# Patient Record
Sex: Male | Born: 1990 | Race: Black or African American | Hispanic: No | Marital: Single | Smoking: Current every day smoker
Health system: Southern US, Community
[De-identification: ages and names within clinical notes are randomized; demographics above are authoritative.]

## PROBLEM LIST (undated history)

## (undated) DIAGNOSIS — B20 Human immunodeficiency virus [HIV] disease: Secondary | ICD-10-CM

## (undated) HISTORY — PX: NO PAST SURGERIES: SHX2092

---
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
## 2011-06-15 ENCOUNTER — Emergency Department (HOSPITAL_COMMUNITY)
Admission: EM | Admit: 2011-06-15 | Discharge: 2011-06-15 | Disposition: A | Discharge status: Home / Self Care | Payer: Federal, State, Local not specified - PPO | Attending: Emergency Medicine | Admitting: Emergency Medicine

## 2011-06-15 ENCOUNTER — Emergency Department (HOSPITAL_COMMUNITY): Payer: Federal, State, Local not specified - PPO

## 2011-06-15 DIAGNOSIS — R42 Dizziness and giddiness: Secondary | ICD-10-CM | POA: Insufficient documentation

## 2011-06-15 DIAGNOSIS — R109 Unspecified abdominal pain: Secondary | ICD-10-CM | POA: Insufficient documentation

## 2011-06-15 DIAGNOSIS — R10819 Abdominal tenderness, unspecified site: Secondary | ICD-10-CM | POA: Insufficient documentation

## 2011-06-15 DIAGNOSIS — I498 Other specified cardiac arrhythmias: Secondary | ICD-10-CM | POA: Insufficient documentation

## 2011-06-15 LAB — CBC
MCH: 29.9 pg (ref 26.0–34.0)
MCV: 88.8 fL (ref 78.0–100.0)
Platelets: 247 10*3/uL (ref 150–400)
RDW: 13.9 % (ref 11.5–15.5)
WBC: 4.4 10*3/uL (ref 4.0–10.5)

## 2011-06-15 LAB — URINALYSIS, ROUTINE W REFLEX MICROSCOPIC
Bilirubin Urine: NEGATIVE
Ketones, ur: NEGATIVE mg/dL
Nitrite: NEGATIVE
Specific Gravity, Urine: 1.018 (ref 1.005–1.030)
Urobilinogen, UA: 1 mg/dL (ref 0.0–1.0)

## 2011-06-15 LAB — BASIC METABOLIC PANEL
BUN: 11 mg/dL (ref 6–23)
Calcium: 9.8 mg/dL (ref 8.4–10.5)
Creatinine, Ser: 1.09 mg/dL (ref 0.50–1.35)
GFR calc Af Amer: >90 mL/min (ref >90)
GFR calc non Af Amer: >90 mL/min (ref >90)

## 2011-06-15 LAB — DIFFERENTIAL
Eosinophils Absolute: 0.1 10*3/uL (ref 0.0–0.7)
Eosinophils Relative: 2 % (ref 0–5)
Lymphs Abs: 1.9 10*3/uL (ref 0.7–4.0)

## 2011-12-28 ENCOUNTER — Emergency Department (HOSPITAL_COMMUNITY)
Admission: EM | Admit: 2011-12-28 | Discharge: 2011-12-29 | Disposition: A | Discharge status: Home Health | Payer: Federal, State, Local not specified - PPO | Attending: Emergency Medicine | Admitting: Emergency Medicine

## 2011-12-28 DIAGNOSIS — S0180XA Unspecified open wound of other part of head, initial encounter: Secondary | ICD-10-CM | POA: Insufficient documentation

## 2011-12-28 DIAGNOSIS — S0181XA Laceration without foreign body of other part of head, initial encounter: Secondary | ICD-10-CM
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
## 2011-12-29 MED ORDER — TETANUS-DIPHTH-ACELL PERTUSSIS 5-2.5-18.5 LF-MCG/0.5 IM SUSP
0.5000 mL | Freq: Once | INTRAMUSCULAR | Status: AC
  Administered 2011-12-29: 0.5 mL via INTRAMUSCULAR
  Filled 2011-12-29: qty 0.5

## 2011-12-29 NOTE — Discharge Instructions (Signed)
Facial Laceration A facial laceration is a cut on the face. Lacerations usually heal quickly, but they need special care to reduce scarring. It will take 1 to 2 years for the scar to lose its redness and to heal completely. TREATMENT  Some facial lacerations may not require closure. Some lacerations may not be able to be closed due to an increased risk of infection. It is important to see your caregiver as soon as possible after an injury to minimize the risk of infection and to maximize the opportunity for successful closure. If closure is appropriate, pain medicines may be given, if needed. The wound will be cleaned to help prevent infection. Your caregiver will use stitches (sutures), staples, wound glue (adhesive), or skin adhesive strips to repair the laceration. These tools bring the skin edges together to allow for faster healing and a better cosmetic outcome. However, all wounds will heal with a scar.  Once the wound has healed, scarring can be minimized by covering the wound with sunscreen during the day for 1 full year. Use a sunscreen with an SPF of at least 30. Sunscreen helps to reduce the pigment that will form in the scar. When applying sunscreen to a completely healed wound, massage the scar for a few minutes to help reduce the appearance of the scar. Use circular motions with your fingertips, on and around the scar. Do not massage a healing wound. HOME CARE INSTRUCTIONS For sutures:  Keep the wound clean and dry.   If you were given a bandage (dressing), you should change it at least once a day. Also change the dressing if it becomes wet or dirty, or as directed by your caregiver.   Wash the wound with soap and water 2 times a day. Rinse the wound off with water to remove all soap. Pat the wound dry with a clean towel.   After cleaning, apply a thin layer of the antibiotic ointment recommended by your caregiver. This will help prevent infection and keep the dressing from sticking.    You may shower as usual after the first 24 hours. Do not soak the wound in water until the sutures are removed.   Only take over-the-counter or prescription medicines for pain, discomfort, or fever as directed by your caregiver.   Get your sutures removed as directed by your caregiver. With facial lacerations, sutures should usually be taken out after 4 to 5 days to avoid stitch marks.   Wait a few days after your sutures are removed before applying makeup.  For skin adhesive strips:  Keep the wound clean and dry.   Do not get the skin adhesive strips wet. You may bathe carefully, using caution to keep the wound dry.   If the wound gets wet, pat it dry with a clean towel.   Skin adhesive strips will fall off on their own. You may trim the strips as the wound heals. Do not remove skin adhesive strips that are still stuck to the wound. They will fall off in time.  For wound adhesive:  You may briefly wet your wound in the shower or bath. Do not soak or scrub the wound. Do not swim. Avoid periods of heavy perspiration until the skin adhesive has fallen off on its own. After showering or bathing, gently pat the wound dry with a clean towel.   Do not apply liquid medicine, cream medicine, ointment medicine, or makeup to your wound while the skin adhesive is in place. This may loosen the film   before your wound is healed.   If a dressing is placed over the wound, be careful not to apply tape directly over the skin adhesive. This may cause the adhesive to be pulled off before the wound is healed.   Avoid prolonged exposure to sunlight or tanning lamps while the skin adhesive is in place. Exposure to ultraviolet light in the first year will darken the scar.   The skin adhesive will usually remain in place for 5 to 10 days, then naturally fall off the skin. Do not pick at the adhesive film.  You may need a tetanus shot if:  You cannot remember when you had your last tetanus shot.   You have  never had a tetanus shot.  If you get a tetanus shot, your arm may swell, get red, and feel warm to the touch. This is common and not a problem. If you need a tetanus shot and you choose not to have one, there is a rare chance of getting tetanus. Sickness from tetanus can be serious. SEEK IMMEDIATE MEDICAL CARE IF:  You develop redness, pain, or swelling around the wound.   There is yellowish-white fluid (pus) coming from the wound.   You develop chills or a fever.  MAKE SURE YOU:  Understand these instructions.   Will watch your condition.   Will get help right away if you are not doing well or get worse.  Document Released: 09/30/2004 Document Revised: 08/12/2011 Document Reviewed: 02/15/2011 Exit[employer] Patient Information 2012 [employer].Assault, General Assault includes any behavior, whether intentional or reckless, which results in bodily injury to another person and/or damage to property. Included in this would be any behavior, intentional or reckless, that by its nature would be understood (interpreted) by a reasonable person as intent to harm another person or to damage his/her property. Threats may be oral or written. They may be communicated through regular mail, computer, fax, or phone. These threats may be direct or implied. FORMS OF ASSAULT INCLUDE:  Physically assaulting a person. This includes physical threats to inflict physical harm as well as:   Slapping.   Hitting.   Poking.   Kicking.   Punching.   Pushing.   Arson.   Sabotage.   Equipment vandalism.   Damaging or destroying property.   Throwing or hitting objects.   Displaying a weapon or an object that appears to be a weapon in a threatening manner.   Carrying a firearm of any kind.   Using a weapon to harm someone.   Using greater physical size/strength to intimidate another.   Making intimidating or threatening gestures.   Bullying.   Hazing.   Intimidating, threatening, hostile,  or abusive language directed toward another person.   It communicates the intention to engage in violence against that person. And it leads a reasonable person to expect that violent behavior may occur.   Stalking another person.  IF IT HAPPENS AGAIN:  Immediately call for emergency help (911 in [place]   If someone poses clear and immediate danger to you, seek legal authorities to have a protective or restraining order put in place.   Less threatening assaults can at least be reported to authorities.  STEPS TO TAKE IF A SEXUAL ASSAULT HAS HAPPENED  Go to an area of safety. This may include a shelter or staying with a friend. Stay away from the area where you have been attacked. A large percentage of sexual assaults are caused by a friend, relative or associate.     If medications were given by your caregiver, take them as directed for the full length of time prescribed.   Only take over-the-counter or prescription medicines for pain, discomfort, or fever as directed by your caregiver.   If you have come in contact with a sexual disease, find out if you are to be tested again. If your caregiver is concerned about the HIV/AIDS virus, he/she may require you to have continued testing for several months.   For the protection of your privacy, test results can not be given over the phone. Make sure you receive the results of your test. If your test results are not back during your visit, make an appointment with your caregiver to find out the results. Do not assume everything is normal if you have not heard from your caregiver or the medical facility. It is important for you to follow up on all of your test results.   File appropriate papers with authorities. This is important in all assaults, even if it has occurred in a family or by a friend.  SEEK MEDICAL CARE IF:  You have new problems because of your injuries.   You have problems that may be because of the medicine you are taking, such as:     Rash.   Itching.   Swelling.   Trouble breathing.   You develop belly (abdominal) pain, feel sick to your stomach (nausea) or are vomiting.   You begin to run a temperature.   You need supportive care or referral to a rape crisis center. These are centers with trained personnel who can help you get through this ordeal.  SEEK IMMEDIATE MEDICAL CARE IF:  You are afraid of being threatened, beaten, or abused. In [place] call 911.   You receive new injuries related to abuse.   You develop severe pain in any area injured in the assault or have any change in your condition that concerns you.   You faint or lose consciousness.   You develop chest pain or shortness of breath.  Document Released: 08/23/2005 Document Revised: 08/12/2011 Document Reviewed: 04/10/2008 [employer] Patient Information 2012 [employer]. 
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
## 2011-12-31 NOTE — ED Provider Notes (Signed)
Medical screening examination/treatment/procedure(s) were performed by non-physician practitioner and as supervising physician I was immediately available for consultation/collaboration. Devoria Albe, MD, FACEP   Ward Givens, MD 12/31/11 [phone]

## 2012-09-02 ENCOUNTER — Emergency Department (HOSPITAL_COMMUNITY)
Admission: EM | Admit: 2012-09-02 | Discharge: 2012-09-02 | Disposition: A | Discharge status: Home / Self Care | Payer: Federal, State, Local not specified - PPO | Attending: Emergency Medicine | Admitting: Emergency Medicine

## 2012-09-02 ENCOUNTER — Encounter (HOSPITAL_COMMUNITY): Payer: Self-pay | Admitting: Emergency Medicine

## 2012-09-02 DIAGNOSIS — F17200 Nicotine dependence, unspecified, uncomplicated: Secondary | ICD-10-CM | POA: Insufficient documentation

## 2012-09-02 DIAGNOSIS — R112 Nausea with vomiting, unspecified: Secondary | ICD-10-CM | POA: Insufficient documentation

## 2012-09-02 DIAGNOSIS — R197 Diarrhea, unspecified: Secondary | ICD-10-CM | POA: Insufficient documentation

## 2012-09-02 MED ORDER — LOPERAMIDE HCL 2 MG PO CAPS: 4.0000 mg | ORAL_CAPSULE | ORAL | Status: DC | PRN

## 2012-09-02 MED ORDER — ONDANSETRON 8 MG PO TBDP
8.0000 mg | ORAL_TABLET | Freq: Once | ORAL | Status: AC
  Administered 2012-09-02: 8 mg via ORAL
  Filled 2012-09-02: qty 1
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
## 2012-09-02 NOTE — ED Notes (Signed)
Pt able to tolerate the water that was given to pt. Pt states he is feeling better and is ok going home.  MD made aware.

## 2014-04-20 ENCOUNTER — Encounter (HOSPITAL_COMMUNITY): Payer: Self-pay | Admitting: Emergency Medicine

## 2014-04-20 ENCOUNTER — Emergency Department (HOSPITAL_COMMUNITY): Payer: Federal, State, Local not specified - PPO

## 2014-04-20 ENCOUNTER — Emergency Department (HOSPITAL_COMMUNITY)
Admission: EM | Admit: 2014-04-20 | Discharge: 2014-04-20 | Disposition: A | Discharge status: Home / Self Care | Payer: Federal, State, Local not specified - PPO | Attending: Emergency Medicine | Admitting: Emergency Medicine

## 2014-04-20 DIAGNOSIS — Y929 Unspecified place or not applicable: Secondary | ICD-10-CM | POA: Insufficient documentation

## 2014-04-20 DIAGNOSIS — S46909A Unspecified injury of unspecified muscle, fascia and tendon at shoulder and upper arm level, unspecified arm, initial encounter: Secondary | ICD-10-CM | POA: Insufficient documentation

## 2014-04-20 DIAGNOSIS — IMO0002 Reserved for concepts with insufficient information to code with codable children: Secondary | ICD-10-CM | POA: Insufficient documentation

## 2014-04-20 DIAGNOSIS — F17200 Nicotine dependence, unspecified, uncomplicated: Secondary | ICD-10-CM | POA: Diagnosis not present

## 2014-04-20 DIAGNOSIS — R296 Repeated falls: Secondary | ICD-10-CM | POA: Insufficient documentation

## 2014-04-20 DIAGNOSIS — L089 Local infection of the skin and subcutaneous tissue, unspecified: Secondary | ICD-10-CM | POA: Insufficient documentation

## 2014-04-20 DIAGNOSIS — S4980XA Other specified injuries of shoulder and upper arm, unspecified arm, initial encounter: Secondary | ICD-10-CM | POA: Insufficient documentation

## 2014-04-20 DIAGNOSIS — S6990XA Unspecified injury of unspecified wrist, hand and finger(s), initial encounter: Secondary | ICD-10-CM

## 2014-04-20 DIAGNOSIS — S59901A Unspecified injury of right elbow, initial encounter: Secondary | ICD-10-CM

## 2014-04-20 DIAGNOSIS — S59919A Unspecified injury of unspecified forearm, initial encounter: Secondary | ICD-10-CM

## 2014-04-20 DIAGNOSIS — Y9389 Activity, other specified: Secondary | ICD-10-CM | POA: Insufficient documentation

## 2014-04-20 DIAGNOSIS — S59909A Unspecified injury of unspecified elbow, initial encounter: Secondary | ICD-10-CM | POA: Diagnosis not present

## 2014-04-20 MED ORDER — TRAMADOL HCL 50 MG PO TABS: 50.0000 mg | ORAL_TABLET | Freq: Four times a day (QID) | ORAL | Status: DC | PRN

## 2014-04-20 MED ORDER — CLINDAMYCIN HCL 150 MG PO CAPS: 300.0000 mg | ORAL_CAPSULE | Freq: Three times a day (TID) | ORAL | Status: DC

## 2014-04-20 NOTE — ED Provider Notes (Signed)
Medical screening examination/treatment/procedure(s) were performed by non-physician practitioner and as supervising physician I was immediately available for consultation/collaboration.   EKG Interpretation None       Dakisha Schoof, MD 04/20/14 2323 

## 2014-04-20 NOTE — ED Notes (Signed)
Pt presents with c/o fall that occurred approx 2 weeks ago. Pt says that he fell onto his right arm and he is unable to completely stretch that arm out. Pt says that he has a scab on that arm as well and is worried that it might be infected. NAD at this time.

## 2014-04-20 NOTE — ED Provider Notes (Signed)
CSN: [id number]     Arrival date & time 04/20/14  1653 History  This chart was scribed for non-physician practitioner, Emilia BeckKaitlyn Kyriana Yankee, PA-C, working with Doug SouSam Jacubowitz, MD, by Bronson CurbJacqueline Melvin, ED Scribe. This patient was seen in room WTR9/WTR9 and the patient's care was started at 5:57 PM.    Chief Complaint  Patient presents with  . Arm Injury     Patient is a 23 y.o. male presenting with arm injury. The history is provided by the patient. No language interpreter was used.  Arm Injury Location:  Elbow Time since incident:  2 weeks Elbow location:  R elbow Pain details:    Radiates to:  Does not radiate   Severity:  Mild   Onset quality:  Sudden   Timing:  Constant   Progression:  Worsening Chronicity:  New Dislocation: no   Foreign body present:  No foreign bodies Prior injury to area:  No Relieved by:  None tried Worsened by:  Nothing tried Ineffective treatments:  None tried Associated symptoms: decreased range of motion   Associated symptoms: no back pain, no fatigue, no fever, no muscle weakness, no neck pain, no numbness, no stiffness, no swelling and no tingling   Risk factors: no concern for non-accidental trauma, no known bone disorder, no frequent fractures and no recent illness     HPI Comments: Duane Oconnell is a 23 y.o. male who presents to the Emergency Department complaining of right arm injury that occurred when patient fell approximately 2 weeks ago. Patient states he fell onto his right arm and is unable to "stretch his arm out" completely. There is associated abrasion that has scabbed over to his right elbow. Patient is concerned for infection. He denies any other injuries. Patient has no history of significant health conditions.   History reviewed. No pertinent past medical history. History reviewed. No pertinent past surgical history. No family history on file. History  Substance Use Topics  . Smoking status: Current Every Day Smoker -- 0.50  packs/day    Types: Cigarettes  . Smokeless tobacco: Not on file  . Alcohol Use: No    Review of Systems  Constitutional: Negative for fever and fatigue.  Musculoskeletal: Negative for back pain, neck pain and stiffness.      Allergies  Review of patient's allergies indicates no known allergies.  Home Medications   Prior to Admission medications   Medication Sig Start Date End Date Taking? Authorizing Provider  ibuprofen (ADVIL,MOTRIN) 200 MG tablet Take 200 mg by mouth every 6 (six) hours as needed. For pain    Historical Provider, MD   Triage Vitals: BP 140/80  Pulse 62  Temp(Src) 98.3 F (36.8 C) (Oral)  Resp 24  SpO2 99%  Physical Exam  Nursing note and vitals reviewed. Constitutional: He is oriented to person, place, and time. He appears well-developed and well-nourished. No distress.  HENT:  Head: Normocephalic and atraumatic.  Eyes: Conjunctivae and EOM are normal.  Neck: Neck supple. No tracheal deviation present.  Cardiovascular: Normal rate.   Pulmonary/Chest: Effort normal. No respiratory distress.  Musculoskeletal:  Patient is unable to fully extend elbow due to pain. No obvious deformity.  Neurological: He is alert and oriented to person, place, and time.  Skin: Skin is warm and dry.  3x3 cm circular, crusting lesion of right elbow that is TTP.  Psychiatric: He has a normal mood and affect. His behavior is normal.    ED Course  Procedures (including critical care time)  DIAGNOSTIC STUDIES: Oxygen  Saturation is 99% on room air, normal by my interpretation.    COORDINATION OF CARE: At 1807 Discussed treatment plan with patient which includes Ultram and Cleocin. Patient agrees.   Labs Review Labs Reviewed - No data to display  Imaging Review Dg Elbow Complete Right  04/20/2014   CLINICAL DATA:  Fall.  Right elbow pain.  EXAM: RIGHT ELBOW - COMPLETE 3+ VIEW  COMPARISON:  None.  FINDINGS: Posterior soft tissue defect/laceration. No underlying bony  abnormality. No fracture, subluxation or dislocation. No joint effusion.  IMPRESSION: Posterior laceration.  No acute bony abnormality.   Electronically Signed   By: Charlett Nose M.D.   On: 04/20/2014 17:48     EKG Interpretation None      MDM   Final diagnoses:  Elbow injury, right, initial encounter  Skin infection    Xray unremarkable for acute changes. Patient has a skin infection overlying the affected area. Patient will be treated with Clindamycin and Tramadol. Patient instructed to follow up with Dr. Orlan Leavens if elbow pain does not resolve in 1 week.   I personally performed the services described in this documentation, which was scribed in my presence. The recorded information has been reviewed and is accurate.    Emilia Beck, PA-C 04/20/14 1902

## 2014-04-20 NOTE — Discharge Instructions (Signed)
Take Clindamycin as directed until gone. Take Tramadol as needed for pain. Follow up with Dr. Melvyn Novasrtmann regarding your elbow if symptoms do not improve in 1 week.

## 2014-11-04 ENCOUNTER — Emergency Department (HOSPITAL_COMMUNITY)
Admission: EM | Admit: 2014-11-04 | Discharge: 2014-11-04 | Disposition: A | Discharge status: Home / Self Care | Payer: Federal, State, Local not specified - PPO | Attending: Emergency Medicine | Admitting: Emergency Medicine

## 2014-11-04 ENCOUNTER — Encounter (HOSPITAL_COMMUNITY): Payer: Self-pay

## 2014-11-04 DIAGNOSIS — Z79899 Other long term (current) drug therapy: Secondary | ICD-10-CM | POA: Insufficient documentation

## 2014-11-04 DIAGNOSIS — R109 Unspecified abdominal pain: Secondary | ICD-10-CM | POA: Diagnosis not present

## 2014-11-04 DIAGNOSIS — R197 Diarrhea, unspecified: Secondary | ICD-10-CM | POA: Diagnosis not present

## 2014-11-04 DIAGNOSIS — R51 Headache: Secondary | ICD-10-CM | POA: Diagnosis present

## 2014-11-04 DIAGNOSIS — Z720 Tobacco use: Secondary | ICD-10-CM | POA: Diagnosis not present

## 2014-11-04 DIAGNOSIS — R519 Headache, unspecified: Secondary | ICD-10-CM

## 2014-11-04 LAB — COMPREHENSIVE METABOLIC PANEL
ALBUMIN: 4.6 g/dL (ref 3.5–5.2)
ALT: 25 U/L (ref 0–53)
ANION GAP: 8 (ref 5–15)
AST: 22 U/L (ref 0–37)
Alkaline Phosphatase: 109 U/L (ref 39–117)
BUN: 16 mg/dL (ref 6–23)
CALCIUM: 9.8 mg/dL (ref 8.4–10.5)
CO2: 30 mmol/L (ref 19–32)
Chloride: 100 mmol/L (ref 96–112)
Creatinine, Ser: 1.16 mg/dL (ref 0.50–1.35)
GFR calc non Af Amer: 87 mL/min — ABNORMAL LOW (ref >90)
GLUCOSE: 95 mg/dL (ref 70–99)
Potassium: 4.2 mmol/L (ref 3.5–5.1)
SODIUM: 138 mmol/L (ref 135–145)
TOTAL PROTEIN: 8.6 g/dL — AB (ref 6.0–8.3)
Total Bilirubin: 0.8 mg/dL (ref 0.3–1.2)

## 2014-11-04 LAB — CBC WITH DIFFERENTIAL/PLATELET
BASOS ABS: 0 10*3/uL (ref 0.0–0.1)
Basophils Relative: 1 % (ref 0–1)
Eosinophils Absolute: 0.1 10*3/uL (ref 0.0–0.7)
Eosinophils Relative: 1 % (ref 0–5)
HEMATOCRIT: 45 % (ref 39.0–52.0)
HEMOGLOBIN: 15.2 g/dL (ref 13.0–17.0)
LYMPHS PCT: 35 % (ref 12–46)
Lymphs Abs: 1.6 10*3/uL (ref 0.7–4.0)
MCH: 30.4 pg (ref 26.0–34.0)
MCHC: 33.8 g/dL (ref 30.0–36.0)
MCV: 90 fL (ref 78.0–100.0)
MONO ABS: 0.4 10*3/uL (ref 0.1–1.0)
MONOS PCT: 9 % (ref 3–12)
NEUTROS ABS: 2.5 10*3/uL (ref 1.7–7.7)
NEUTROS PCT: 54 % (ref 43–77)
Platelets: 268 10*3/uL (ref 150–400)
RBC: 5 MIL/uL (ref 4.22–5.81)
RDW: 14.3 % (ref 11.5–15.5)
WBC: 4.6 10*3/uL (ref 4.0–10.5)

## 2014-11-04 LAB — URINALYSIS, ROUTINE W REFLEX MICROSCOPIC
Bilirubin Urine: NEGATIVE
GLUCOSE, UA: NEGATIVE mg/dL
HGB URINE DIPSTICK: NEGATIVE
KETONES UR: NEGATIVE mg/dL
Leukocytes, UA: NEGATIVE
Nitrite: NEGATIVE
PROTEIN: NEGATIVE mg/dL
Specific Gravity, Urine: 1.016 (ref 1.005–1.030)
UROBILINOGEN UA: 1 mg/dL (ref 0.0–1.0)
pH: 7 (ref 5.0–8.0)

## 2014-11-04 MED ORDER — ONDANSETRON HCL 4 MG PO TABS: 4.0000 mg | ORAL_TABLET | Freq: Four times a day (QID) | ORAL | Status: DC

## 2014-11-04 MED ORDER — SODIUM CHLORIDE 0.9 % IV BOLUS (SEPSIS)
1000.0000 mL | Freq: Once | INTRAVENOUS | Status: AC
  Administered 2014-11-04: 1000 mL via INTRAVENOUS

## 2014-11-04 MED ORDER — KETOROLAC TROMETHAMINE 30 MG/ML IJ SOLN
30.0000 mg | Freq: Once | INTRAMUSCULAR | Status: AC
  Administered 2014-11-04: 30 mg via INTRAVENOUS
  Filled 2014-11-04: qty 1

## 2014-11-04 MED ORDER — ONDANSETRON 8 MG PO TBDP
8.0000 mg | ORAL_TABLET | Freq: Once | ORAL | Status: AC
  Administered 2014-11-04: 8 mg via ORAL
  Filled 2014-11-04: qty 1

## 2014-11-04 MED ORDER — DIPHENHYDRAMINE HCL 50 MG/ML IJ SOLN
25.0000 mg | Freq: Once | INTRAMUSCULAR | Status: AC
  Administered 2014-11-04: 25 mg via INTRAVENOUS
  Filled 2014-11-04: qty 1

## 2014-11-04 MED ORDER — METOCLOPRAMIDE HCL 5 MG/ML IJ SOLN
10.0000 mg | Freq: Once | INTRAMUSCULAR | Status: AC
  Administered 2014-11-04: 10 mg via INTRAVENOUS
  Filled 2014-11-04: qty 2

## 2014-11-04 NOTE — ED Provider Notes (Signed)
CSN: [id number]     Arrival date & time 11/04/14  1036 History   First MD Initiated Contact with Patient 11/04/14 1222     Chief Complaint  Patient presents with  . Abdominal Pain  . Headache     (Consider location/radiation/quality/duration/timing/severity/associated sxs/prior Treatment) HPI Comments: Patient presents to the emergency department with chief complaint of headache. He states that he woke up with a headache this morning. States that he then became nauseated after heat, school and had one episode of vomiting. He denies any numbness, weakness, tingling, vision changes, or other symptoms. Denies any fevers chills. There are no aggravating or alleviating factors. Patient states headache was gradual in onset and has progressively worsened.  The history is provided by the patient. No language interpreter was used.    History reviewed. No pertinent past medical history. History reviewed. No pertinent past surgical history. History reviewed. No pertinent family history. History  Substance Use Topics  . Smoking status: Current Every Day Smoker -- 0.50 packs/day    Types: Cigarettes  . Smokeless tobacco: Not on file  . Alcohol Use: No    Review of Systems  Constitutional: Negative for fever and chills.  Respiratory: Negative for shortness of breath.   Cardiovascular: Negative for chest pain.  Gastrointestinal: Positive for nausea, vomiting and diarrhea. Negative for constipation.  Genitourinary: Negative for dysuria.  Neurological: Positive for headaches.  All other systems reviewed and are negative.     Allergies  Review of patient's allergies indicates no known allergies.  Home Medications   Prior to Admission medications   Medication Sig Start Date End Date Taking? Authorizing Provider  clindamycin (CLEOCIN) 150 MG capsule Take 2 capsules (300 mg total) by mouth 3 (three) times daily. May dispense as  capsules Patient not taking: Reported on 11/04/2014  04/20/14   Emilia Beck, PA-C  traMADol (ULTRAM) 50 MG tablet Take 1 tablet (50 mg total) by mouth every 6 (six) hours as needed. Patient not taking: Reported on 11/04/2014 04/20/14   Kaitlyn Szekalski, PA-C   BP 145/91 mmHg  Pulse 63  Temp(Src) 98.9 F (37.2 C) (Oral)  Resp 16  SpO2 99% Physical Exam  Constitutional: He is oriented to person, place, and time. He appears well-developed and well-nourished.  HENT:  Head: Normocephalic and atraumatic.  Right Ear: External ear normal.  Left Ear: External ear normal.  Eyes: Conjunctivae and EOM are normal. Pupils are equal, round, and reactive to light.  Neck: Normal range of motion. Neck supple.  No pain with neck flexion, no meningismus  Cardiovascular: Normal rate, regular rhythm and normal heart sounds.  Exam reveals no gallop and no friction rub.   No murmur heard. Pulmonary/Chest: Effort normal and breath sounds normal. No respiratory distress. He has no wheezes. He has no rales. He exhibits no tenderness.  Abdominal: Soft. He exhibits no distension and no mass. There is no tenderness. There is no rebound and no guarding.  No focal abdominal tenderness, no RLQ tenderness or pain at McBurney's point, no RUQ tenderness or Murphy's sign, no left-sided abdominal tenderness, no fluid wave, or signs of peritonitis   Musculoskeletal: Normal range of motion. He exhibits no edema or tenderness.  Normal gait.  Neurological: He is alert and oriented to person, place, and time. He has normal reflexes.  CN 3-12 intact, normal finger to nose, no pronator drift, sensation and strength intact bilaterally.  Skin: Skin is warm and dry.  Psychiatric: He has a normal mood and affect. His behavior is  normal. Judgment and thought content normal.  Nursing note and vitals reviewed.   ED Course  Procedures (including critical care time) Results for orders placed or performed during the hospital encounter of 11/04/14  CBC with Differential  Result  Value Ref Range   WBC 4.6 4.0 - 10.5 K/uL   RBC 5.00 4.22 - 5.81 MIL/uL   Hemoglobin 15.2 13.0 - 17.0 g/dL   HCT 81.145.0 91.439.0 - 78.252.0 %   MCV 90.0 78.0 - 100.0 fL   MCH 30.4 26.0 - 34.0 pg   MCHC 33.8 30.0 - 36.0 g/dL   RDW 95.614.3 21.311.5 - 08.615.5 %   Platelets 268 150 - 400 K/uL   Neutrophils Relative % 54 43 - 77 %   Neutro Abs 2.5 1.7 - 7.7 K/uL   Lymphocytes Relative 35 12 - 46 %   Lymphs Abs 1.6 0.7 - 4.0 K/uL   Monocytes Relative 9 3 - 12 %   Monocytes Absolute 0.4 0.1 - 1.0 K/uL   Eosinophils Relative 1 0 - 5 %   Eosinophils Absolute 0.1 0.0 - 0.7 K/uL   Basophils Relative 1 0 - 1 %   Basophils Absolute 0.0 0.0 - 0.1 K/uL  Comprehensive metabolic panel  Result Value Ref Range   Sodium 138 135 - 145 mmol/L   Potassium 4.2 3.5 - 5.1 mmol/L   Chloride 100 96 - 112 mmol/L   CO2 30 19 - 32 mmol/L   Glucose, Bld 95 70 - 99 mg/dL   BUN 16 6 - 23 mg/dL   Creatinine, Ser 5.781.16 0.50 - 1.35 mg/dL   Calcium 9.8 8.4 - 46.910.5 mg/dL   Total Protein 8.6 (H) 6.0 - 8.3 g/dL   Albumin 4.6 3.5 - 5.2 g/dL   AST 22 0 - 37 U/L   ALT 25 0 - 53 U/L   Alkaline Phosphatase 109 39 - 117 U/L   Total Bilirubin 0.8 0.3 - 1.2 mg/dL   GFR calc non Af Amer 87 (L) >90 mL/min   GFR calc Af Amer >90 >90 mL/min   Anion gap 8 5 - 15  Urinalysis, Routine w reflex microscopic  Result Value Ref Range   Color, Urine YELLOW YELLOW   APPearance CLEAR CLEAR   Specific Gravity, Urine 1.016 1.005 - 1.030   pH 7.0 5.0 - 8.0   Glucose, UA NEGATIVE NEGATIVE mg/dL   Hgb urine dipstick NEGATIVE NEGATIVE   Bilirubin Urine NEGATIVE NEGATIVE   Ketones, ur NEGATIVE NEGATIVE mg/dL   Protein, ur NEGATIVE NEGATIVE mg/dL   Urobilinogen, UA 1.0 0.0 - 1.0 mg/dL   Nitrite NEGATIVE NEGATIVE   Leukocytes, UA NEGATIVE NEGATIVE   No results found.    EKG Interpretation None      MDM   Final diagnoses:  Headache, unspecified headache type  2. Nausea, vomiting, and diarrhea  Pt HA treated and improved while in ED.   Presentation is like pts typical HA and non concerning for [facility], ICH, Meningitis, or temporal arteritis. Pt is afebrile with no focal neuro deficits, nuchal rigidity, or change in vision. Pt is to follow up with PCP to discuss prophylactic medication. Pt verbalizes understanding and is agreeable with plan to dc.   Patient instructed to return for:  New or worsening symptoms, including, increased abdominal pain, especially pain that localizes to one side, bloody vomit, bloody diarrhea, fever >101, and intractable vomiting.      Roxy Horsemanobert Mirabella Hilario, PA-C 11/04/14 1415  Toy CookeyMegan Docherty, MD 11/05/14 [phone]

## 2014-11-04 NOTE — ED Notes (Signed)
Pt states woke up this morning with headache.  States he was in class and became nauseated.  Vomited small amount x 1 today.  No fever.

## 2014-11-04 NOTE — Discharge Instructions (Signed)
Nausea and Vomiting °Nausea is a sick feeling that often comes before throwing up (vomiting). Vomiting is a reflex where stomach contents come out of your mouth. Vomiting can cause severe loss of body fluids (dehydration). Children and elderly adults can become dehydrated quickly, especially if they also have diarrhea. Nausea and vomiting are symptoms of a condition or disease. It is important to find the cause of your symptoms. °CAUSES  °· Direct irritation of the stomach lining. This irritation can result from increased acid production (gastroesophageal reflux disease), infection, food poisoning, taking certain medicines (such as nonsteroidal anti-inflammatory drugs), alcohol use, or tobacco use. °· Signals from the brain. These signals could be caused by a headache, heat exposure, an inner ear disturbance, increased pressure in the brain from injury, infection, a tumor, or a concussion, pain, emotional stimulus, or metabolic problems. °· An obstruction in the gastrointestinal tract (bowel obstruction). °· Illnesses such as diabetes, hepatitis, gallbladder problems, appendicitis, kidney problems, cancer, sepsis, atypical symptoms of a heart attack, or eating disorders. °· Medical treatments such as chemotherapy and radiation. °· Receiving medicine that makes you sleep (general anesthetic) during surgery. °DIAGNOSIS °Your caregiver may ask for tests to be done if the problems do not improve after a few days. Tests may also be done if symptoms are severe or if the reason for the nausea and vomiting is not clear. Tests may include: °· Urine tests. °· Blood tests. °· Stool tests. °· Cultures (to look for evidence of infection). °· X-rays or other imaging studies. °Test results can help your caregiver make decisions about treatment or the need for additional tests. °TREATMENT °You need to stay well hydrated. Drink frequently but in small amounts. You may wish to drink water, sports drinks, clear broth, or eat frozen  ice pops or gelatin dessert to help stay hydrated. When you eat, eating slowly may help prevent nausea. There are also some antinausea medicines that may help prevent nausea. °HOME CARE INSTRUCTIONS  °· Take all medicine as directed by your caregiver. °· If you do not have an appetite, do not force yourself to eat. However, you must continue to drink fluids. °· If you have an appetite, eat a normal diet unless your caregiver tells you differently. °· Eat a variety of complex carbohydrates (rice, wheat, potatoes, bread), lean meats, yogurt, fruits, and vegetables. °· Avoid high-fat foods because they are more difficult to digest. °· Drink enough water and fluids to keep your urine clear or pale yellow. °· If you are dehydrated, ask your caregiver for specific rehydration instructions. Signs of dehydration may include: °· Severe thirst. °· Dry lips and mouth. °· Dizziness. °· Dark urine. °· Decreasing urine frequency and amount. °· Confusion. °· Rapid breathing or pulse. °SEEK IMMEDIATE MEDICAL CARE IF:  °· You have blood or brown flecks (like coffee grounds) in your vomit. °· You have black or bloody stools. °· You have a severe headache or stiff neck. °· You are confused. °· You have severe abdominal pain. °· You have chest pain or trouble breathing. °· You do not urinate at least once every 8 hours. °· You develop cold or clammy skin. °· You continue to vomit for longer than 24 to 48 hours. °· You have a fever. °MAKE SURE YOU:  °· Understand these instructions. °· Will watch your condition. °· Will get help right away if you are not doing well or get worse. °Document Released: 08/23/2005 Document Revised: 11/15/2011 Document Reviewed: 01/20/2011 °Exit[employer]® Patient Information ©2015 [employer]. This information is not intended   to replace advice given to you by your health care provider. Make sure you discuss any questions you have with your health care provider. °General Headache Without Cause °A headache is pain  or discomfort felt around the head or neck area. The specific cause of a headache may not be found. There are many causes and types of headaches. A few common ones are: °· Tension headaches. °· Migraine headaches. °· Cluster headaches. °· Chronic daily headaches. °HOME CARE INSTRUCTIONS  °· Keep all follow-up appointments with your caregiver or any specialist referral. °· Only take over-the-counter or prescription medicines for pain or discomfort as directed by your caregiver. °· Lie down in a dark, quiet room when you have a headache. °· Keep a headache journal to find out what may trigger your migraine headaches. For example, write down: °¨ What you eat and drink. °¨ How much sleep you get. °¨ Any change to your diet or medicines. °· Try massage or other relaxation techniques. °· Put ice packs or heat on the head and neck. Use these 3 to 4 times per day for 15 to 20 minutes each time, or as needed. °· Limit stress. °· Sit up straight, and do not tense your muscles. °· Quit smoking if you smoke. °· Limit alcohol use. °· Decrease the amount of caffeine you drink, or stop drinking caffeine. °· Eat and sleep on a regular schedule. °· Get 7 to 9 hours of sleep, or as recommended by your caregiver. °· Keep lights dim if bright lights bother you and make your headaches worse. °SEEK MEDICAL CARE IF:  °· You have problems with the medicines you were prescribed. °· Your medicines are not working. °· You have a change from the usual headache. °· You have nausea or vomiting. °SEEK IMMEDIATE MEDICAL CARE IF:  °· Your headache becomes severe. °· You have a fever. °· You have a stiff neck. °· You have loss of vision. °· You have muscular weakness or loss of muscle control. °· You start losing your balance or have trouble walking. °· You feel faint or pass out. °· You have severe symptoms that are different from your first symptoms. °MAKE SURE YOU:  °· Understand these instructions. °· Will watch your condition. °· Will get help  right away if you are not doing well or get worse. °Document Released: 08/23/2005 Document Revised: 11/15/2011 Document Reviewed: 09/08/2011 °Exit[employer]® Patient Information ©2015 [employer]. This information is not intended to replace advice given to you by your health care provider. Make sure you discuss any questions you have with your health care provider. ° °

## 2015-04-12 ENCOUNTER — Encounter (HOSPITAL_COMMUNITY): Payer: Self-pay | Admitting: Emergency Medicine

## 2015-04-12 ENCOUNTER — Emergency Department (HOSPITAL_COMMUNITY): Payer: Federal, State, Local not specified - PPO

## 2015-04-12 ENCOUNTER — Emergency Department (HOSPITAL_COMMUNITY)
Admission: EM | Admit: 2015-04-12 | Discharge: 2015-04-12 | Disposition: A | Discharge status: Home / Self Care | Payer: Federal, State, Local not specified - PPO | Attending: Emergency Medicine | Admitting: Emergency Medicine

## 2015-04-12 DIAGNOSIS — Z720 Tobacco use: Secondary | ICD-10-CM | POA: Insufficient documentation

## 2015-04-12 DIAGNOSIS — R0602 Shortness of breath: Secondary | ICD-10-CM | POA: Insufficient documentation

## 2015-04-12 DIAGNOSIS — R000 Tachycardia, unspecified: Secondary | ICD-10-CM | POA: Diagnosis not present

## 2015-04-12 DIAGNOSIS — F1410 Cocaine abuse, uncomplicated: Secondary | ICD-10-CM | POA: Diagnosis not present

## 2015-04-12 DIAGNOSIS — R079 Chest pain, unspecified: Secondary | ICD-10-CM

## 2015-04-12 DIAGNOSIS — F1210 Cannabis abuse, uncomplicated: Secondary | ICD-10-CM | POA: Diagnosis not present

## 2015-04-12 DIAGNOSIS — R509 Fever, unspecified: Secondary | ICD-10-CM | POA: Insufficient documentation

## 2015-04-12 LAB — BASIC METABOLIC PANEL
Anion gap: 10 (ref 5–15)
BUN: 11 mg/dL (ref 6–20)
CO2: 23 mmol/L (ref 22–32)
Calcium: 8.7 mg/dL — ABNORMAL LOW (ref 8.9–10.3)
Chloride: 105 mmol/L (ref 101–111)
Creatinine, Ser: 1.38 mg/dL — ABNORMAL HIGH (ref 0.61–1.24)
GFR calc Af Amer: >60 mL/min (ref >60)
GFR calc non Af Amer: >60 mL/min (ref >60)
Glucose, Bld: 121 mg/dL — ABNORMAL HIGH (ref 65–99)
Potassium: 3.9 mmol/L (ref 3.5–5.1)
Sodium: 138 mmol/L (ref 135–145)

## 2015-04-12 LAB — RAPID URINE DRUG SCREEN, HOSP PERFORMED
Amphetamines: NOT DETECTED
Barbiturates: NOT DETECTED
Benzodiazepines: NOT DETECTED
Cocaine: POSITIVE — AB
Opiates: NOT DETECTED
TETRAHYDROCANNABINOL: POSITIVE — AB

## 2015-04-12 LAB — D-DIMER, QUANTITATIVE (NOT AT ARMC)

## 2015-04-12 LAB — CBC
HCT: 41.3 % (ref 39.0–52.0)
Hemoglobin: 14.3 g/dL (ref 13.0–17.0)
MCH: 30.6 pg (ref 26.0–34.0)
MCHC: 34.6 g/dL (ref 30.0–36.0)
MCV: 88.2 fL (ref 78.0–100.0)
Platelets: 268 10*3/uL (ref 150–400)
RBC: 4.68 MIL/uL (ref 4.22–5.81)
RDW: 14.1 % (ref 11.5–15.5)
WBC: 4.7 10*3/uL (ref 4.0–10.5)

## 2015-04-12 LAB — I-STAT TROPONIN, ED: Troponin i, poc: 0 ng/mL (ref 0.00–0.08)

## 2015-04-12 LAB — RAPID HIV SCREEN (HIV 1/2 AB+AG)
HIV 1/2 ANTIBODIES: NONREACTIVE
HIV-1 P24 Antigen - HIV24: NONREACTIVE

## 2015-04-12 MED ORDER — LORAZEPAM 2 MG/ML IJ SOLN
1.0000 mg | Freq: Once | INTRAMUSCULAR | Status: AC
  Administered 2015-04-12: 1 mg via INTRAVENOUS
  Filled 2015-04-12: qty 1

## 2015-04-12 MED ORDER — SODIUM CHLORIDE 0.9 % IV BOLUS (SEPSIS)
1000.0000 mL | Freq: Once | INTRAVENOUS | Status: AC
  Administered 2015-04-12: 1000 mL via INTRAVENOUS

## 2015-04-12 MED ORDER — ACETAMINOPHEN 325 MG PO TABS
650.0000 mg | ORAL_TABLET | Freq: Once | ORAL | Status: AC
  Administered 2015-04-12: 650 mg via ORAL
  Filled 2015-04-12: qty 2

## 2015-04-12 NOTE — ED Notes (Signed)
Awake. Verbally responsive. A/O x4. Resp even and unlabored. No audible adventitious breath sounds noted. ABC's intact.  

## 2015-04-12 NOTE — ED Provider Notes (Signed)
CSN: [id number]     Arrival date & time 04/12/15  0603 History   First MD Initiated Contact with Patient 04/12/15 [phone]     Chief Complaint  Patient presents with  . Chest Pain     (Consider location/radiation/quality/duration/timing/severity/associated sxs/prior Treatment) HPI Duane Oconnell is a 24 y.o. male with no medical problems presents to emergency complaining of chest pain and shortness of breath. Patient states that he works third shift and just got off work. 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 Patient states his symptoms started last night after smoking a blunt with a friend. Patient is not sure exactly wasn't a blunt. He denies any other drugs are taking any other pills. He states he does feel feverish as well. He denies any cough or congestion. He denies any abdominal pain. No pain in extremities. No recent travel surgeries. No prior cardiac history. He did not take anything for his symptoms prior to coming in. Patient is also requesting STD check, no symptoms.  History reviewed. No pertinent past medical history. History reviewed. No pertinent past surgical history. History reviewed. No pertinent family history. History  Substance Use Topics  . Smoking status: Current Every Day Smoker -- 0.50 packs/day    Types: Cigarettes  . Smokeless tobacco: Never Used  . Alcohol Use: Yes    Review of Systems  Constitutional: Positive for fever and chills.  Respiratory: Positive for chest tightness and shortness of breath. Negative for cough.   Cardiovascular: Negative for chest pain, palpitations and leg swelling.  Gastrointestinal: Negative for nausea, vomiting, abdominal pain, diarrhea and abdominal distention.  Genitourinary: Negative for dysuria, urgency, frequency, hematuria, penile swelling, scrotal swelling, penile pain and testicular pain.  Musculoskeletal: Negative for myalgias, arthralgias, neck pain and neck stiffness.  Skin: Negative for rash.   Allergic/Immunologic: Negative for immunocompromised state.  Neurological: Negative for dizziness, weakness, light-headedness, numbness and headaches.  All other systems reviewed and are negative.     Allergies  Review of patient's allergies indicates no known allergies.  Home Medications   Prior to Admission medications   Medication Sig Start Date End Date Taking? Authorizing Provider  tetrahydrozoline 0.05 % ophthalmic solution Place 1 drop into both eyes 3 (three) times daily as needed (for contacts.).   Yes Historical Provider, MD  ondansetron (ZOFRAN) 4 MG tablet Take 1 tablet (4 mg total) by mouth every 6 (six) hours. Patient not taking: Reported on 04/12/2015 11/04/14   Roxy Horseman, PA-C   BP 155/86 mmHg  Pulse 135  Temp(Src) 99.9 F (37.7 C) (Oral)  Resp 18  Ht 5\' 9"  (1.753 m)  Wt 140 lb (63.504 kg)  BMI 20.67 kg/m2  SpO2 97% Physical Exam  Constitutional: He appears well-developed and well-nourished. No distress.  HENT:  Head: Normocephalic and atraumatic.  Eyes: Conjunctivae are normal.  Neck: Neck supple.  Cardiovascular: Regular rhythm and normal heart sounds.  Tachycardia present.   Pulmonary/Chest: Effort normal. No respiratory distress. He has no wheezes. He has no rales.  Abdominal: Soft. Bowel sounds are normal. He exhibits no distension. There is no tenderness. There is no rebound.  Musculoskeletal: He exhibits no edema.  Neurological: He is alert.  Skin: Skin is warm and dry.  Nursing note and vitals reviewed.   ED Course  Procedures (including critical care time) Labs Review Labs Reviewed  BASIC METABOLIC PANEL - Abnormal; Notable for the following:    Glucose, Bld 121 (*)    Creatinine, Ser 1.38 (*)    Calcium 8.7 (*)  All other components within normal limits  CBC  D-DIMER, QUANTITATIVE (NOT AT [facility])  RAPID HIV SCREEN (HIV 1/2 AB+AG)  RPR  URINE RAPID DRUG SCREEN, HOSP PERFORMED  I-STAT TROPOININ, ED  GC/CHLAMYDIA PROBE AMP ([facility]) NOT AT [facility]    Imaging Review Dg Chest 2 View  04/12/2015   CLINICAL DATA:  Chest tightness and shortness of breath beginning yesterday. Smoker.  EXAM: CHEST  2 VIEW  COMPARISON:  None.  FINDINGS: Cardiomediastinal silhouette is normal. The lungs are clear without pleural effusions or focal consolidations. Trachea projects midline and there is no pneumothorax. Soft tissue planes and included osseous structures are non-suspicious. Mild broad dextroscoliosis of the upper thoracic spine.  IMPRESSION: Normal chest.   Electronically Signed   By: Awilda Metro M.D.   On: 04/12/2015 06:38     EKG Interpretation   Date/Time:  Saturday April 12 2015 06:16:39 EDT Ventricular Rate:  137 PR Interval:  140 QRS Duration: 93 QT Interval:  292 QTC Calculation: 441 R Axis:   80 Text Interpretation:  Sinus tachycardia LAE, consider biatrial enlargement  RSR' in V1 or V2, probably normal variant Confirmed by Juleen China  MD, STEPHEN  ([id number]) on 04/12/2015 6:25:09 AM      MDM   Final diagnoses:  Cocaine abuse  Marijuana abuse  Chest pain, unspecified chest pain type    patient emergency department with chest pain, shortness of breath, tachycardia, heart rate is 135 and a monitor. Sinus rhythm. Patient admits to smoking a blunt prior to symptoms starting. He is also requesting STD check. He refused GU swab, will send gonorrhea chlamydia by urine, will get RPR and HIV.  9:06 AM Chest x-ray negative, labs unremarkable except for slightly elevated creatinine. Hydrated with 2 L of normal saline. Patient's drug screen is positive for marijuana and cocaine. Patient continues to deny use of cocaine. I do think this is most likely why patient is having his symptoms. His heart rate came down to 80s, after fluids and Ativan. He feels much better. He is asking to be discharged home. At this time VS are all within normal. He is not having any chest pain. D dimer negative, doubt PE. No risk factors for CAD.  Stable for dc home with follow up. Instructed to stop using drugs.   Filed Vitals:   04/12/15 0800  BP: 120/73  Pulse: 100  Temp:   Resp: [address] PA-C 04/12/15 1603  Raeford Razor, MD 04/13/15 [phone]

## 2015-04-12 NOTE — ED Notes (Signed)
Resting quietly with eyes closed. Easily arousable. Verbally responsive. A/O x4. Resp even and unlabored. No audible adventitious breath sounds noted. ABC's intact. SR on monitor. IV saline lock patent and intact.

## 2015-04-12 NOTE — ED Notes (Signed)
Awake. Verbally responsive. A/O x4. Resp even and unlabored. No audible adventitious breath sounds noted. ABC's intact. IV infusing NS at 999ml/hr without difficulty. 

## 2015-04-12 NOTE — Discharge Instructions (Signed)
Take tylenol or ibuprofen for pain. Follow up as needed. Your drug screen came back positive for cocaine and marijuana. Stop using drugs.   Stimulant Use Disorder-Cocaine Cocaine is one of a group of powerful drugs called stimulants. Cocaine has medical uses for stopping nosebleeds and for pain control before minor nose or dental surgery. However, cocaine is misused because of the effects that it produces. These effects include:   A feeling of extreme pleasure.  Alertness.  High energy. Common street names for cocaine include coke, crack, blow, snow, and nose candy. Cocaine is snorted, dissolved in water and injected, or smoked.  Stimulants are addictive because they activate regions of the brain that produce both the pleasurable sensation of "reward" and psychological dependence. Together, these actions account for loss of control and the rapid development of drug dependence. This means you become ill without the drug (withdrawal) and need to keep using it to function.  Stimulant use disorder is use of stimulants that disrupts your daily life. It disrupts relationships with family and friends and how you do your job. Cocaine increases your blood pressure and heart rate. It can cause a heart attack or stroke. Cocaine can also cause death from irregular heart rate or seizures. SYMPTOMS Symptoms of stimulant use disorder with cocaine include:  Use of cocaine in larger amounts or over a longer period of time than intended.  Unsuccessful attempts to cut down or control cocaine use.  A lot of time spent obtaining, using, or recovering from the effects of cocaine.  A strong desire or urge to use cocaine (craving).  Continued use of cocaine in spite of major problems at work, school, or home because of use.  Continued use of cocaine in spite of relationship problems because of use.  Giving up or cutting down on important life activities because of cocaine use.  Use of cocaine over and over in  situations when it is physically hazardous, such as driving a car.  Continued use of cocaine in spite of a physical problem that is likely related to use. Physical problems can include:  Malnutrition.  Nosebleeds.  Chest pain.  High blood pressure.  A hole that develops between the part of your nose that separates your nostrils (perforated nasal septum).  Lung and kidney damage.  Continued use of cocaine in spite of a mental problem that is likely related to use. Mental problems can include:  Schizophrenia-like symptoms.  Depression.  Bipolar mood swings.  Anxiety.  Sleep problems.  Need to use more and more cocaine to get the same effect, or lessened effect over time with use of the same amount of cocaine (tolerance).  Having withdrawal symptoms when cocaine use is stopped, or using cocaine to reduce or avoid withdrawal symptoms. Withdrawal symptoms include:  Depressed or irritable mood.  Low energy or restlessness.  Bad dreams.  Poor or excessive sleep.  Increased appetite. DIAGNOSIS Stimulant use disorder is diagnosed by your health care provider. You may be asked questions about your cocaine use and how it affects your life. A physical exam may be done. A drug screen may be ordered. You may be referred to a mental health professional. The diagnosis of stimulant use disorder requires at least two symptoms within 12 months. The type of stimulant use disorder depends on the number of signs and symptoms you have. The type may be:  Mild. Two or three signs and symptoms.  Moderate. Four or five signs and symptoms.  Severe. Six or more signs and  symptoms. TREATMENT Treatment for stimulant use disorder is usually provided by mental health professionals with training in substance use disorders. The following options are available:  Counseling or talk therapy. Talk therapy addresses the reasons you use cocaine and ways to keep you from using again. Goals of talk therapy  include:  Identifying and avoiding triggers for use.  Handling cravings.  Replacing use with healthy activities.  Support groups. Support groups provide emotional support, advice, and guidance.  Medicine. Certain medicines may decrease cocaine cravings or withdrawal symptoms. HOME CARE INSTRUCTIONS  Take medicines only as directed by your health care provider.  Identify the people and activities that trigger your cocaine use and avoid them.  Keep all follow-up visits as directed by your health care provider. SEEK MEDICAL CARE IF:  Your symptoms get worse or you relapse.  You are not able to take medicines as directed. SEEK IMMEDIATE MEDICAL CARE IF:  You have serious thoughts about hurting yourself or others.  You have a seizure, chest pain, sudden weakness, or loss of speech or vision. FOR MORE INFORMATION  National Institute on Drug Abuse: http://www.price-smith.com/  Substance Abuse and Mental Health Services Administration: SkateOasis.com.pt Document Released: 08/20/2000 Document Revised: 01/07/2014 Document Reviewed: 09/05/2013 [facility] Patient Information 2015 [place], [place]. This information is not intended to replace advice given to you by your health care provider. Make sure you discuss any questions you have with your health care provider.

## 2015-04-12 NOTE — ED Notes (Signed)
Pt presents to ED with c/o shortness of breath, onset yesterday noon.  Pt reports "constant tightness" in chest.  Pt denies dizziness or headache.

## 2015-04-12 NOTE — ED Notes (Signed)
Pt refused urethral swab.

## 2015-04-12 NOTE — ED Notes (Signed)
Lab called and reported that HIV was negative.

## 2015-04-13 LAB — RPR, QUANT+TP ABS (REFLEX)
Rapid Plasma Reagin, Quant: 1:64 {titer} — ABNORMAL HIGH
TREPONEMA PALLIDUM AB: POSITIVE — AB

## 2015-04-13 LAB — RPR: RPR Ser Ql: REACTIVE — AB

## 2015-04-14 ENCOUNTER — Telehealth (HOSPITAL_COMMUNITY): Payer: Self-pay

## 2015-04-14 NOTE — Telephone Encounter (Signed)
Reactive RPR 1:64. Chart sent to EDP office for review.

## 2015-04-16 ENCOUNTER — Encounter (HOSPITAL_COMMUNITY): Payer: Self-pay | Admitting: Emergency Medicine

## 2015-04-16 ENCOUNTER — Emergency Department (HOSPITAL_COMMUNITY)
Admission: EM | Admit: 2015-04-16 | Discharge: 2015-04-16 | Disposition: A | Discharge status: Home / Self Care | Payer: Federal, State, Local not specified - PPO | Attending: Emergency Medicine | Admitting: Emergency Medicine

## 2015-04-16 ENCOUNTER — Telehealth (HOSPITAL_COMMUNITY): Payer: Self-pay

## 2015-04-16 DIAGNOSIS — Z720 Tobacco use: Secondary | ICD-10-CM | POA: Diagnosis not present

## 2015-04-16 DIAGNOSIS — Z202 Contact with and (suspected) exposure to infections with a predominantly sexual mode of transmission: Secondary | ICD-10-CM | POA: Diagnosis present

## 2015-04-16 DIAGNOSIS — A539 Syphilis, unspecified: Secondary | ICD-10-CM | POA: Diagnosis not present

## 2015-04-16 MED ORDER — PENICILLIN G BENZATHINE 1200000 UNIT/2ML IM SUSP
2.4000 10*6.[IU] | Freq: Once | INTRAMUSCULAR | Status: AC
  Administered 2015-04-16: 2.4 10*6.[IU] via INTRAMUSCULAR
  Filled 2015-04-16: qty 4

## 2015-04-16 NOTE — Telephone Encounter (Signed)
Spoke with pt. Will return for treatment of positive RPR. Needs penicillin shot per dr Blinda Leatherwood

## 2015-04-16 NOTE — ED Notes (Addendum)
States he was recently seen here and tested for STDs. Says someone called to tell him the test came back positive for syphilis, and he was advised to come here for antibiotics. States nothing has changed/worsened since he was last seen here, denies pain

## 2015-04-16 NOTE — ED Provider Notes (Signed)
CSN: [id number]     Arrival date & time 04/16/15  1314 History  This chart was scribed for non-physician practitioner, Everlene Farrier, PA-C working with Melene Plan, DO by Gwenyth Ober, ED scribe. This patient was seen in room WTR6/WTR6 and the patient's care was started at 3:22 PM   Chief Complaint  Patient presents with  . Exposure to STD   The history is provided by the patient. No language interpreter was used.    HPI Comments: Duane Oconnell is a 24 y.o. male who presents to the Emergency Department for antibiotics to treat syphilis. He states one, nonpainful lesion on the shaft of his penis that appeared 2 weeks ago. Pt was seen in the ED on 8/6, requested STD testing and was found positive for syphilis. He is sexually active and does not always use protection. Pt denies any allergies to medication. He also denies abdominal pain, nausea, vomiting, dysuria, penile discharge, testicular pain, difficulty urinating, hematuria, urgency, urinary frequency, fever and chills as associated symptoms.   History reviewed. No pertinent past medical history. History reviewed. No pertinent past surgical history. History reviewed. No pertinent family history. Social History  Substance Use Topics  . Smoking status: Current Every Day Smoker -- 0.50 packs/day    Types: Cigarettes  . Smokeless tobacco: Never Used  . Alcohol Use: Yes    Review of Systems  Constitutional: Negative for fever and chills.  Gastrointestinal: Negative for nausea, vomiting and abdominal pain.  Genitourinary: Negative for dysuria, urgency, frequency, hematuria, discharge, penile swelling, scrotal swelling, difficulty urinating, penile pain and testicular pain.  Skin: Positive for rash.  Neurological: Negative for headaches.   Allergies  Review of patient's allergies indicates no known allergies.  Home Medications   Prior to Admission medications   Medication Sig Start Date End Date Taking? Authorizing Provider   tetrahydrozoline 0.05 % ophthalmic solution Place 1 drop into both eyes 3 (three) times daily as needed (for contacts.).   Yes Historical Provider, MD   BP 119/78 mmHg  Pulse 91  Temp(Src) 98.7 F (37.1 C) (Oral)  Resp 16  SpO2 97% Physical Exam  Constitutional: He appears well-developed and well-nourished. No distress.  Nontoxic appearing.  HENT:  Head: Normocephalic and atraumatic.  Mouth/Throat: Oropharynx is clear and moist.  Eyes: Conjunctivae are normal. Pupils are equal, round, and reactive to light. Right eye exhibits no discharge. Left eye exhibits no discharge.  Neck: Neck supple.  Cardiovascular: Normal rate, regular rhythm, normal heart sounds and intact distal pulses.   Pulmonary/Chest: Effort normal and breath sounds normal. No respiratory distress. He has no wheezes. He has no rales.  Abdominal: Soft. There is no tenderness. There is no guarding.  Abdomen is soft and non-tender to palpation.  Genitourinary: No penile tenderness.  Chaperone (scribe) present. 2 lesions noted, 0.5 cm in diameter, 1 at base of shaft of penis, the other distal to that, both are nontender to palpation and consistent with syphilis chancre.  Normal scrotal contents No testicular tenderness No penile discharge No penile tenderness No other rashes or lesions noted  Lymphadenopathy:    He has no cervical adenopathy.  Neurological: He is alert. Coordination normal.  Skin: Skin is warm and dry. No rash noted. He is not diaphoretic.  Psychiatric: He has a normal mood and affect. His behavior is normal.  Nursing note and vitals reviewed.  Chaperone (scribe) was present for exam which was performed with no discomfort or complications.   ED Course  Procedures   DIAGNOSTIC STUDIES:  Oxygen Saturation is 97% on RA, normal by my interpretation.    COORDINATION OF CARE: 3:28 PM Discussed treatment plan with pt which includes GC/CHL and penicillin. Pt agreed to plan.   Labs Review Labs  Reviewed  GC/CHLAMYDIA PROBE AMP ([place]) NOT AT [facility]    Imaging Review No results found.   EKG Interpretation None      Filed Vitals:   04/16/15 1332 04/16/15 1334  BP:  119/78  Pulse: 91   Temp: 98.7 F (37.1 C)   TempSrc: Oral   Resp: 16   SpO2: 97%      MDM   Meds given in ED:  Medications  penicillin g benzathine (BICILLIN LA) 1200000 UNIT/2ML injection 2.4 Million Units (not administered)    New Prescriptions   No medications on file    Final diagnoses:  Syphilis   This is a 24 year old male who presents to the emergency department for treatment of a positive syphilis test. Patient was tested positive for syphilis on 04/12/2015. Patient reports he's had a painless lesion on his penis for the past 2 weeks.He denies any penile discharge. He denies any testicular pain or tenderness. He denies any urinary symptoms. He denies any fevers or chills. Patient had full STD panel however his gonorrhea and Chlamydia testing did not result. This was re-collected today and sent for testing. I advised him he needs to follow-up to ensure the results of this test in 3 days.On exam the patient has a painless chancre at the base of he shaft of his penis consistent with a syphilis chancre.There is no penile tenderness or discharge. Scrotal contents is normal.He is afebrile nontoxic appearing. Patient treated with penicillin in the emergency department. I advised him he needs to follow-up in 6 months and at 12 months to have repeat syphilis testing to ensure the treatment worked.I also advised him to return at any time if he has return of his symptoms. I advised the patient to follow-up with their primary care provider this week. I advised the patient to return to the emergency department with new or worsening symptoms or new concerns. The patient verbalized understanding and agreement with plan.    I personally performed the services described in this documentation, which was scribed in  my presence. The recorded information has been reviewed and is accurate.    Everlene Farrier, PA-C 04/16/15 1546  Melene Plan, DO 04/16/15 2235

## 2015-04-16 NOTE — Discharge Instructions (Signed)
Syphilis Syphilis is an infectious disease. It can cause serious complications if left untreated.  CAUSES  Syphilis is caused by a type of bacteria called Treponema pallidum. It is most commonly spread through sexual contact. Syphilis may also spread to a fetus through the blood of the mother.  SIGNS AND SYMPTOMS Symptoms vary depending on the stage of the disease. Some symptoms may disappear without treatment. However, this does not mean that the infection is gone. One form of syphilis (called latent syphilis) has no symptoms.  Primary Syphilis  Painless sores (chancres) in and around the genital organs and mouth.  Swollen lymph nodes near the sores. Secondary Syphilis  A rash or sores over any portion of the body, including the palms of the hands and soles of the feet.  Fever.  Headache.  Sore throat.  Swollen lymph nodes.  New sores in the mouth or on the genitals.  Feeling generally ill.  Having pain in the joints. Tertiary Syphilis The third stage of syphilis involves severe damage to different organs in the body, such as the brain, spinal cord, and heart. Signs and symptoms may include:   Dementia.  Personality and mood changes.  Difficulty walking.  Heart failure.  Fainting.  Enlargement (aneurysm) of the aorta.  Tumors of the skin, bones, or liver.  Muscle weakness.  Sudden "lightning" pains, numbness, or tingling.  Problems with coordination.  Vision changes. DIAGNOSIS   A physical exam will be done.  Blood tests will be done to confirm the diagnosis.  If the disease is in the first or second stages, a fluid (drainage) sample from a sore or rash may be examined under a microscope to detect the disease-causing bacteria.  Fluid around the spine may need to be examined to detect brain damage or inflammation of the brain lining (meningitis).  If the disease is in the third stage, X-rays, CT scans, MRIs, echocardiograms, ultrasounds, or cardiac  catheterization may also be done to detect disease of the heart, aorta, or brain. TREATMENT  Syphilis can be cured with antibiotic medicine if a diagnosis is made early. During the first day of treatment, you may experience fever, chills, headache, nausea, or aching all over your body. This is a normal reaction to the antibiotics.  HOME CARE INSTRUCTIONS   Take your antibiotic medicine as directed by your health care provider. Finish the antibiotic even if you start to feel better. Incomplete treatment will put you at risk for continued infection and could be life threatening.  Take medicines only as directed by your health care provider.  Do not have sexual intercourse until your treatment is completed or as directed by your health care provider.  Inform your recent sexual partners that you were diagnosed with syphilis. They need to seek care and treatment, even if they have no symptoms. It is necessary that all your sexual partners be tested for infection and treated if they have the disease.  Keep all follow-up visits as directed by your health care provider. It is important to keep all your appointments.  If your test results are not ready during your visit, make an appointment with your health care provider to find out the results. Do not assume everything is normal if you have not heard from your health care provider or the medical facility. It is your responsibility to get your test results. SEEK MEDICAL CARE IF:  You continue to have any of the following 24 hours after beginning treatment:  Fever.  Chills.  Headache.  Nausea.  Aching all over your body.  You have symptoms of an allergic reaction to medicine, such as:  Chills.  A headache.  Light-headedness.  A new rash (especially hives).  Difficulty breathing. MAKE SURE YOU:   Understand these instructions.  Will watch your condition.  Will get help right away if you are not doing well or get worse. Document  Released: 06/13/2013 Document Revised: 01/07/2014 Document Reviewed: 06/13/2013 [facility] Patient Information 2015 [place], [place]. This information is not intended to replace advice given to you by your health care provider. Make sure you discuss any questions you have with your health care provider.  Sexually Transmitted Disease A sexually transmitted disease (STD) is a disease or infection that may be passed (transmitted) from person to person, usually during sexual activity. This may happen by way of saliva, semen, blood, vaginal mucus, or urine. Common STDs include:   Gonorrhea.   Chlamydia.   Syphilis.   HIV and AIDS.   Genital herpes.   Hepatitis B and C.   Trichomonas.   Human papillomavirus (HPV).   Pubic lice.   Scabies.  Mites.  Bacterial vaginosis. WHAT ARE CAUSES OF STDs? An STD may be caused by bacteria, a virus, or parasites. STDs are often transmitted during sexual activity if one person is infected. However, they may also be transmitted through nonsexual means. STDs may be transmitted after:   Sexual intercourse with an infected person.   Sharing sex toys with an infected person.   Sharing needles with an infected person or using unclean piercing or tattoo needles.  Having intimate contact with the genitals, mouth, or rectal areas of an infected person.   Exposure to infected fluids during birth. WHAT ARE THE SIGNS AND SYMPTOMS OF STDs? Different STDs have different symptoms. Some people may not have any symptoms. If symptoms are present, they may include:   Painful or bloody urination.   Pain in the pelvis, abdomen, vagina, anus, throat, or eyes.   A skin rash, itching, or irritation.  Growths, ulcerations, blisters, or sores in the genital and anal areas.  Abnormal vaginal discharge with or without bad odor.   Penile discharge in men.   Fever.   Pain or bleeding during sexual intercourse.   Swollen glands in the groin  area.   Yellow skin and eyes (jaundice). This is seen with hepatitis.   Swollen testicles.  Infertility.  Sores and blisters in the mouth. HOW ARE STDs DIAGNOSED? To make a diagnosis, your health care provider may:   Take a medical history.   Perform a physical exam.   Take a sample of any discharge to examine.  Swab the throat, cervix, opening to the penis, rectum, or vagina for testing.  Test a sample of your first morning urine.   Perform blood tests.   Perform a Pap test, if this applies.   Perform a colposcopy.   Perform a laparoscopy.  HOW ARE STDs TREATED? Treatment depends on the STD. Some STDs may be treated but not cured.   Chlamydia, gonorrhea, trichomonas, and syphilis can be cured with antibiotic medicine.   Genital herpes, hepatitis, and HIV can be treated, but not cured, with prescribed medicines. The medicines lessen symptoms.   Genital warts from HPV can be treated with medicine or by freezing, burning (electrocautery), or surgery. Warts may come back.   HPV cannot be cured with medicine or surgery. However, abnormal areas may be removed from the cervix, vagina, or vulva.   If your diagnosis is  confirmed, your recent sexual partners need treatment. This is true even if they are symptom-free or have a negative culture or evaluation. They should not have sex until their health care providers say it is okay. HOW CAN I REDUCE MY RISK OF GETTING AN STD? Take these steps to reduce your risk of getting an STD:  Use latex condoms, dental dams, and water-soluble lubricants during sexual activity. Do not use petroleum jelly or oils.  Avoid having multiple sex partners.  Do not have sex with someone who has other sex partners.  Do not have sex with anyone you do not know or who is at high risk for an STD.  Avoid risky sex practices that can break your skin.  Do not have sex if you have open sores on your mouth or skin.  Avoid drinking too  much alcohol or taking illegal drugs. Alcohol and drugs can affect your judgment and put you in a vulnerable position.  Avoid engaging in oral and anal sex acts.  Get vaccinated for HPV and hepatitis. If you have not received these vaccines in the past, talk to your health care provider about whether one or both might be right for you.   If you are at risk of being infected with HIV, it is recommended that you take a prescription medicine daily to prevent HIV infection. This is called pre-exposure prophylaxis (PrEP). You are considered at risk if:  You are a man who has sex with other men (MSM).  You are a heterosexual man or woman and are sexually active with more than one partner.  You take drugs by injection.  You are sexually active with a partner who has HIV.  Talk with your health care provider about whether you are at high risk of being infected with HIV. If you choose to begin PrEP, you should first be tested for HIV. You should then be tested every 3 months for as long as you are taking PrEP.  WHAT SHOULD I DO IF I THINK I HAVE AN STD?  See your health care provider.   Tell your sexual partner(s). They should be tested and treated for any STDs.  Do not have sex until your health care provider says it is okay. WHEN SHOULD I GET IMMEDIATE MEDICAL CARE? Contact your health care provider right away if:   You have severe abdominal pain.  You are a man and notice swelling or pain in your testicles.  You are a woman and notice swelling or pain in your vagina. Document Released: 11/13/2002 Document Revised: 08/28/2013 Document Reviewed: 03/13/2013 [facility] Patient Information 2015 [place], [place]. This information is not intended to replace advice given to you by your health care provider. Make sure you discuss any questions you have with your health care provider.

## 2015-04-17 LAB — GC/CHLAMYDIA PROBE AMP (~~LOC~~) NOT AT ARMC
Chlamydia: NEGATIVE
Neisseria Gonorrhea: NEGATIVE

## 2015-12-30 ENCOUNTER — Encounter (HOSPITAL_COMMUNITY): Payer: Self-pay

## 2015-12-30 ENCOUNTER — Emergency Department (HOSPITAL_COMMUNITY)
Admission: EM | Admit: 2015-12-30 | Discharge: 2015-12-30 | Disposition: A | Discharge status: Home / Self Care | Payer: Federal, State, Local not specified - PPO | Attending: Emergency Medicine | Admitting: Emergency Medicine

## 2015-12-30 DIAGNOSIS — S61219A Laceration without foreign body of unspecified finger without damage to nail, initial encounter: Secondary | ICD-10-CM

## 2015-12-30 DIAGNOSIS — S61211A Laceration without foreign body of left index finger without damage to nail, initial encounter: Secondary | ICD-10-CM | POA: Insufficient documentation

## 2015-12-30 DIAGNOSIS — W260XXA Contact with knife, initial encounter: Secondary | ICD-10-CM | POA: Insufficient documentation

## 2015-12-30 DIAGNOSIS — S6992XA Unspecified injury of left wrist, hand and finger(s), initial encounter: Secondary | ICD-10-CM | POA: Diagnosis present

## 2015-12-30 DIAGNOSIS — Y998 Other external cause status: Secondary | ICD-10-CM | POA: Diagnosis not present

## 2015-12-30 DIAGNOSIS — F17210 Nicotine dependence, cigarettes, uncomplicated: Secondary | ICD-10-CM | POA: Diagnosis not present

## 2015-12-30 DIAGNOSIS — Y9389 Activity, other specified: Secondary | ICD-10-CM | POA: Insufficient documentation

## 2015-12-30 DIAGNOSIS — Y9289 Other specified places as the place of occurrence of the external cause: Secondary | ICD-10-CM | POA: Diagnosis not present

## 2015-12-30 MED ORDER — BACITRACIN ZINC 500 UNIT/GM EX OINT: 1.0000 "application " | TOPICAL_OINTMENT | Freq: Two times a day (BID) | CUTANEOUS | Status: DC

## 2015-12-30 NOTE — ED Notes (Signed)
Pt with left index finger cut by chefs knife.  Bandaide in place.  No bleeding noted.

## 2015-12-30 NOTE — ED Provider Notes (Signed)
CSN: [id number]     Arrival date & time 12/30/15  1622 History  By signing my name below, I, Freida Busmaniana Omoyeni, attest that this documentation has been prepared under the direction and in the presence of non-physician practitioner, Everlene FarrierWilliam Artelia Game, PA-C. Electronically Signed: Freida Busmaniana Omoyeni, Scribe. 12/30/2015. 6:03 PM.     Chief Complaint  Patient presents with  . Extremity Laceration    The history is provided by the patient. No language interpreter was used.     HPI Comments:  Duane Lenznthony Hellberg is a 25 y.o. male who presents to the Emergency Department complaining of a small laceration to the left index finger sustained yesterday. It happened more than 24 hours ago.  Pt cut the finger while cutting an onion. Bleeding controlled PTA. Pt is right hand dominant. No numbness, weakness, or tingling. Pt has no other complaints or symptoms at this time. Tetanus is UTD-2013.   History reviewed. No pertinent past medical history. History reviewed. No pertinent past surgical history. History reviewed. No pertinent family history. Social History  Substance Use Topics  . Smoking status: Current Every Day Smoker -- 0.50 packs/day    Types: Cigarettes  . Smokeless tobacco: Never Used  . Alcohol Use: Yes    Review of Systems  Constitutional: Negative for fever.  Skin: Positive for wound. Negative for color change.  Neurological: Negative for weakness and numbness.   Allergies  Review of patient's allergies indicates no known allergies.  Home Medications   Prior to Admission medications   Medication Sig Start Date End Date Taking? Authorizing Provider  bacitracin ointment Apply 1 application topically 2 (two) times daily. 12/30/15   Everlene FarrierWilliam Candy Ziegler, PA-C   BP 128/80 mmHg  Pulse 80  Temp(Src) 99 F (37.2 C) (Oral)  Resp 16  SpO2 99% Physical Exam  Constitutional: He appears well-developed and well-nourished. No distress.  HENT:  Head: Normocephalic and atraumatic.  Eyes: Conjunctivae are  normal. Pupils are equal, round, and reactive to light. Right eye exhibits no discharge. Left eye exhibits no discharge.  Neck: Neck supple.  Cardiovascular: Normal rate and intact distal pulses.   Bilateral radial pulses are intact. Good capillary refill to his left distal fingertips.  Pulmonary/Chest: Effort normal.  Musculoskeletal: Normal range of motion. He exhibits no tenderness.  Good range of motion of his left index finger.   Lymphadenopathy:    He has no cervical adenopathy.  Neurological: He is alert. Coordination normal.  Skin: Skin is warm and dry. No rash noted. He is not diaphoretic. No erythema. No pallor.  1.5 cm laceration to the lateral aspect of the distal left index finger; bleeding controlled, well approximated.No sign of infection. Cap refill less than 2 sec.   Psychiatric: He has a normal mood and affect. His behavior is normal.  Nursing note and vitals reviewed.   ED Course  Wound repair Date/Time: 12/30/2015 6:00 PM Performed by: Everlene FarrierANSIE, Virgene Tirone Authorized by: Everlene FarrierANSIE, Tayvien Kane Consent: Verbal consent obtained. Risks and benefits: risks, benefits and alternatives were discussed Consent given by: patient Patient understanding: patient states understanding of the procedure being performed Patient consent: the patient's understanding of the procedure matches consent given Procedure consent: procedure consent matches procedure scheduled Relevant documents: relevant documents present and verified Test results: test results available and properly labeled Site marked: the operative site was marked Required items: required blood products, implants, devices, and special equipment available Patient identity confirmed: verbally with patient Time out: Immediately prior to procedure a "time out" was called to verify the correct  patient, procedure, equipment, support staff and site/side marked as required. Local anesthesia used: no Patient sedated: no Patient tolerance:  Patient tolerated the procedure well with no immediate complications Comments: Cleansing and bandaging of left index finger laceration.     DIAGNOSTIC STUDIES:  Oxygen Saturation is 99% on RA, normal by my interpretation.    COORDINATION OF CARE:  6:00 PM Discussed treatment plan with pt at bedside and pt agreed to plan.   MDM    Final diagnoses:  Finger laceration, initial encounter    Thii is a 25 y.o. male who presents to the Emergency Department complaining of a small laceration to the left index finger sustained yesterday. It happened more than 24 hours ago.  Pt cut the finger while cutting an onion. Bleeding controlled PTA. Pt is right hand dominant. No numbness, weakness Tetanus UTD. Laceration occurred > 24 hours prior to arrival.  Laceration is well approximated. The laceration was cleaned by me and dressed by me. Will place in finger splint to help prevent any reopening of the wound. I encouraged him to change his bandages at least twice a day and to keep them out of water. As the wound is greater than 24 hours old will not repair. Bleeding is controlled. He is neurovascularly intact. Discussed wound care with pt and answered questions. Pt to f-u with PCP in 2-3 days; sooner should there be signs of dehiscence or infection. Pt is hemodynamically stable with no complaints prior to dc.  I advised the patient to follow-up with their primary care provider this week. I advised the patient to return to the emergency department with new or worsening symptoms or new concerns. The patient verbalized understanding and agreement with plan.    I personally performed the services described in this documentation, which was scribed in my presence. The recorded information has been reviewed and is accurate.      Everlene Farrier, PA-C 12/30/15 1818  Richardean Canal, MD 01/01/16 Rickey Primus

## 2015-12-30 NOTE — Discharge Instructions (Signed)
Nonsutured Laceration Care °A laceration is a cut that goes through all layers of the skin and extends into the tissue that is right under the skin. This type of cut is usually stitched up (sutured) or closed with tape (adhesive strips) or skin glue shortly after the injury happens. °However, if the wound is dirty or if several hours pass before medical treatment is provided, it is likely that germs (bacteria) will enter the wound. Closing a laceration after bacteria have entered it increases the risk of infection. In these cases, your health care provider may leave the laceration open (nonsutured) and cover it with a bandage. This type of treatment helps prevent infection and allows the wound to heal from the deepest layer of tissue damage up to the surface. °An open fracture is a type of injury that may involve nonsutured lacerations. An open fracture is a break in a bone that happens along with one or more lacerations through the skin that is near the fracture site. °HOW TO CARE FOR YOUR NONSUTURED LACERATION °· Take or apply over-the-counter and prescription medicines only as told by your health care provider. °· If you were prescribed an antibiotic medicine, take or apply it as told by your health care provider. Do not stop using the antibiotic even if your condition improves. °· Clean the wound one time each day or as told by your health care provider. °¨ Wash the wound with mild soap and water. °¨ Rinse the wound with water to remove all soap. °¨ Pat your wound dry with a clean towel. Do not rub the wound. °· Do not inject anything into the wound unless your health care provider told you to. °· Change any bandages (dressings) as told by your health care provider. This includes changing the dressing if it gets wet, dirty, or starts to smell bad. °· Keep the dressing dry until your health care provider says it can be removed. Do not take baths, swim, or do anything that puts your wound underwater until your  health care provider approves. °· Raise (elevate) the injured area above the level of your heart while you are sitting or lying down, if possible. °· Do not scratch or pick at the wound. °· Check your wound every day for signs of infection. Watch for: °¨ Redness, swelling, or pain. °¨ Fluid, blood, or pus. °· Keep all follow-up visits as told by your health care provider. This is important. °SEEK MEDICAL CARE IF: °· You received a tetanus and shot and you have swelling, severe pain, redness, or bleeding at the injection site.   °· You have a fever. °· Your pain is not controlled with medicine. °· You have increased redness, swelling, or pain at the site of your wound. °· You have fluid, blood, or pus coming from your wound. °· You notice a bad smell coming from your wound or your dressing. °· You notice something coming out of the wound, such as wood or glass. °· You notice a change in the color of your skin near your wound. °· You develop a new rash. °· You need to change the dressing frequently due to fluid, blood, or pus draining from the wound. °· You develop numbness around your wound. °SEEK IMMEDIATE MEDICAL CARE IF: °· Your pain suddenly increases and is severe. °· You develop severe swelling around the wound. °· The wound is on your hand or foot and you cannot properly move a finger or toe. °· The wound is on your hand or   foot and you notice that your fingers or toes look pale or bluish. °· You have a red streak going away from your wound. °  °This information is not intended to replace advice given to you by your health care provider. Make sure you discuss any questions you have with your health care provider. °  °Document Released: 07/21/2006 Document Revised: 01/07/2015 Document Reviewed: 08/19/2014 °Elsevier Interactive Patient Education ©2016 [employer] ° °

## 2016-07-18 IMAGING — CR DG CHEST 2V
2 series · 2 of 2 positions shown · non-contrast
Comparison: None.

CLINICAL DATA: Chest tightness and shortness of breath beginning
yesterday. Smoker.

EXAM:
CHEST  2 VIEW

[w chest pa]
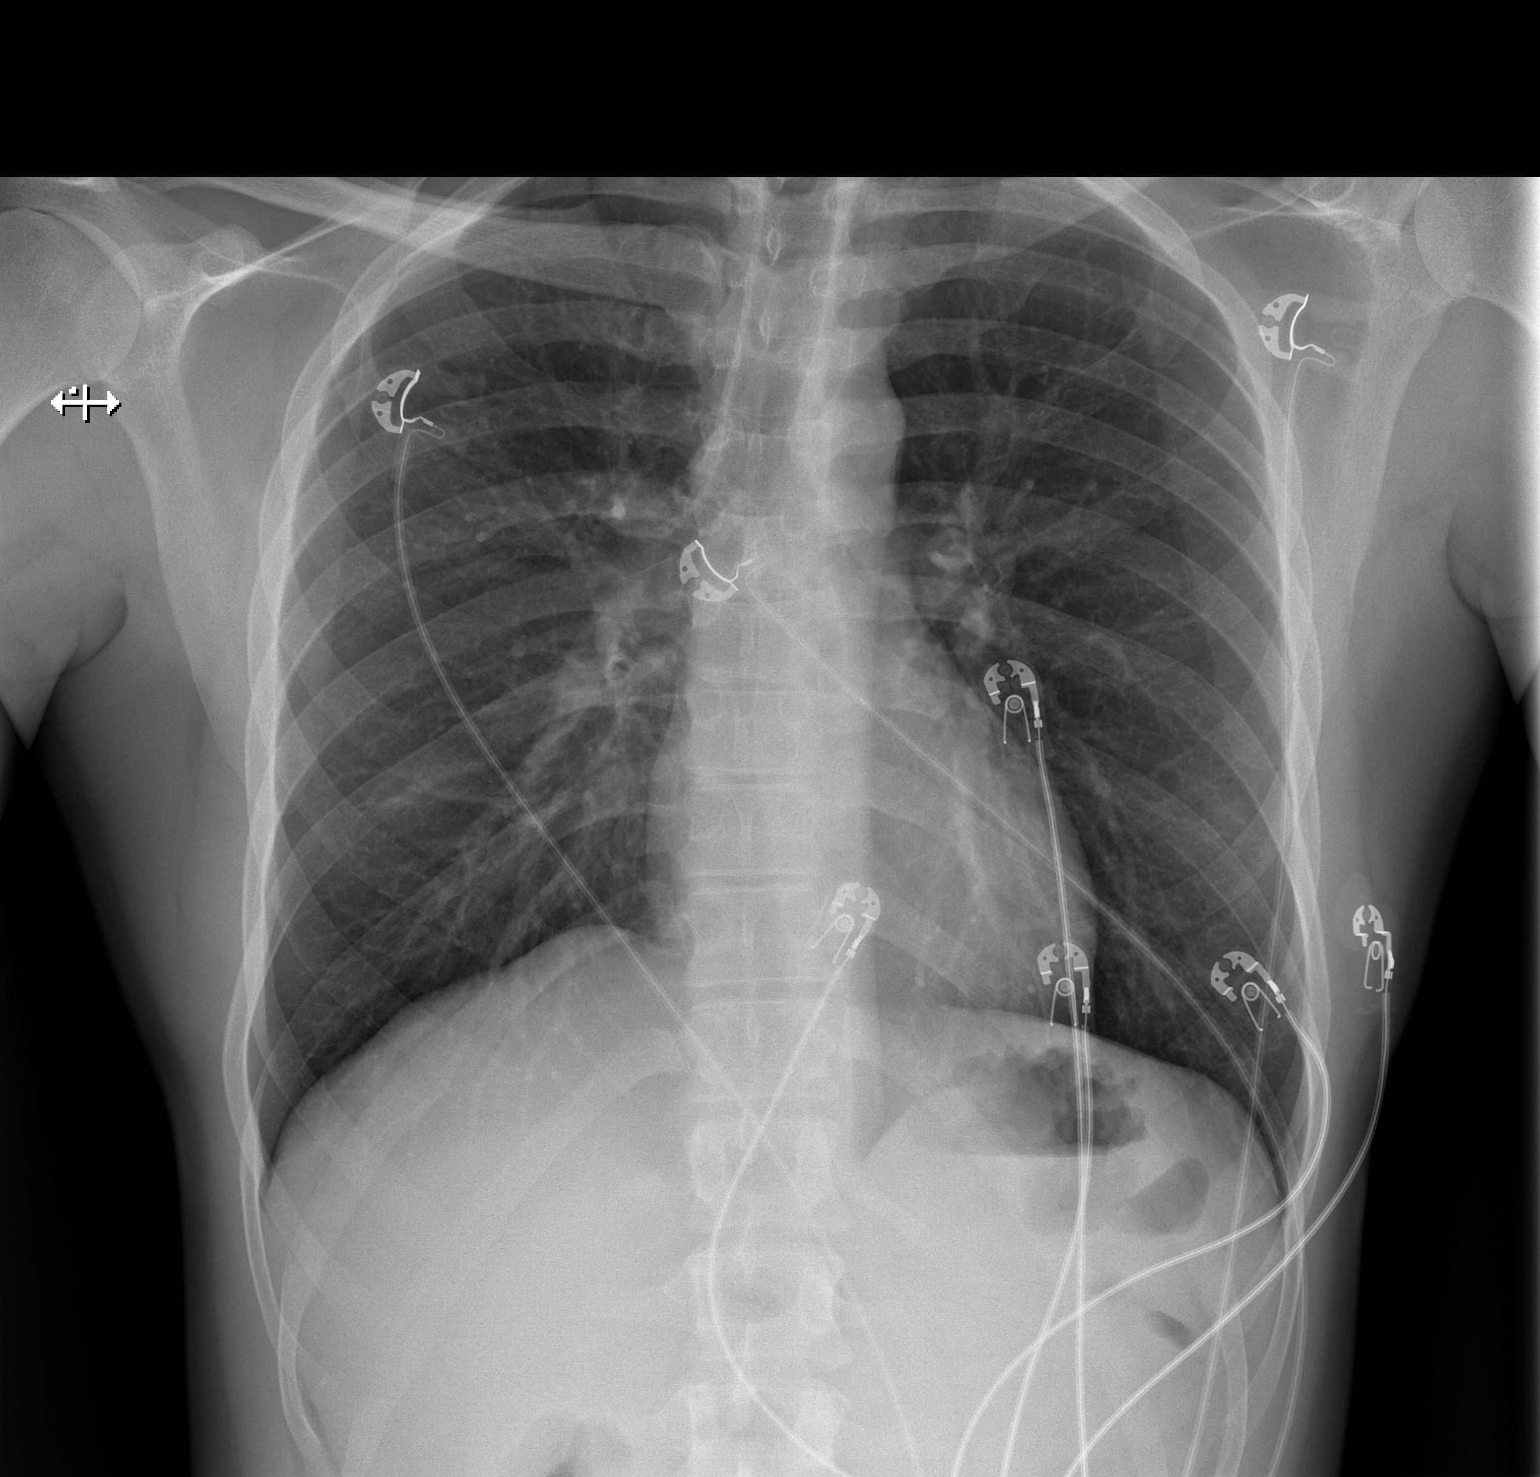

[w chest lat]
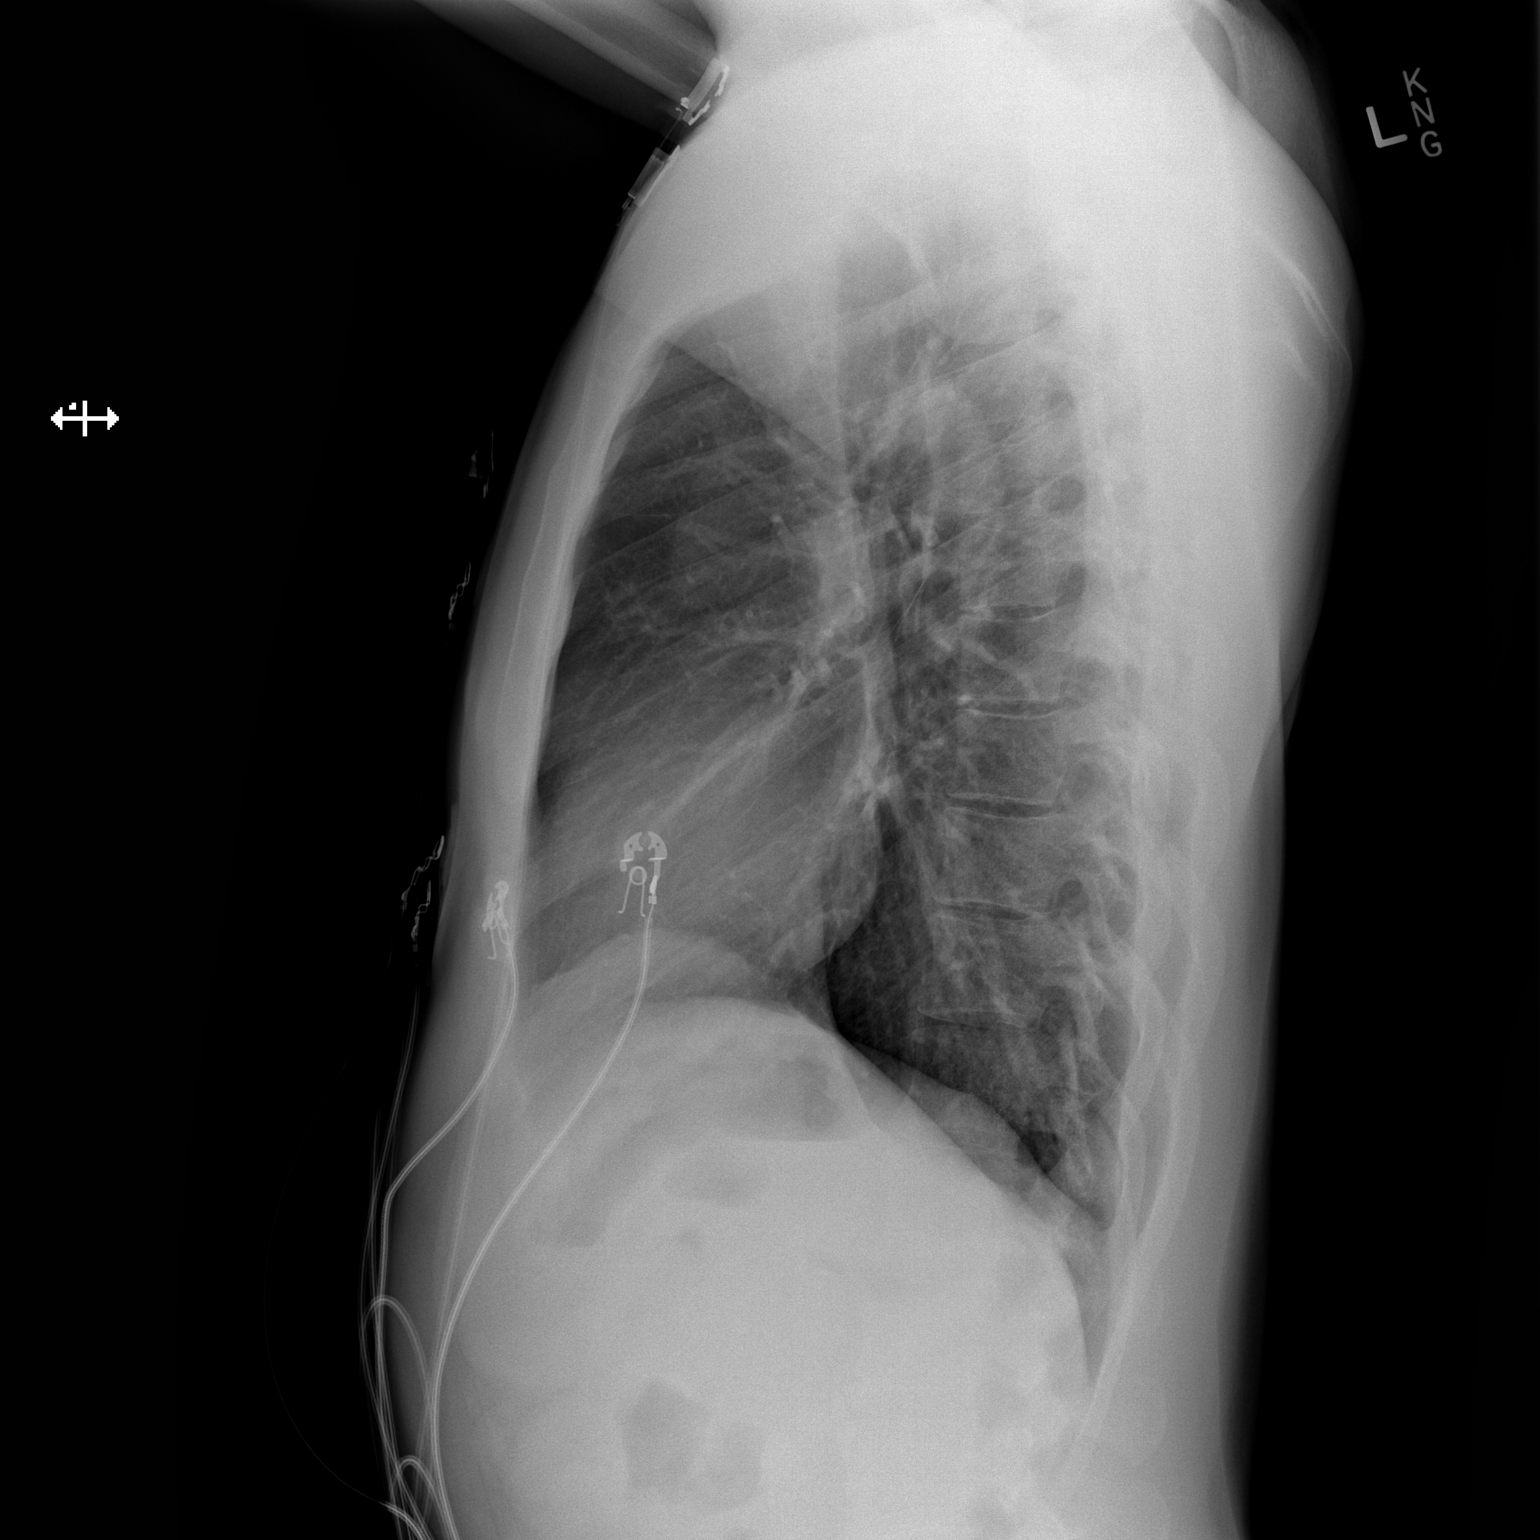

[2 of 2 positions shown; findings below may reference images not displayed]

FINDINGS: Cardiomediastinal silhouette is normal. The lungs are clear without
pleural effusions or focal consolidations. Trachea projects midline
and there is no pneumothorax. Soft tissue planes and included
osseous structures are non-suspicious. Mild broad dextroscoliosis of
the upper thoracic spine.
IMPRESSION: Normal chest.

## 2016-09-08 ENCOUNTER — Emergency Department (HOSPITAL_COMMUNITY)
Admission: EM | Admit: 2016-09-08 | Discharge: 2016-09-08 | Disposition: A | Discharge status: Home / Self Care | Payer: Federal, State, Local not specified - PPO | Attending: Emergency Medicine | Admitting: Emergency Medicine

## 2016-09-08 ENCOUNTER — Ambulatory Visit (HOSPITAL_COMMUNITY)
Admission: EM | Admit: 2016-09-08 | Discharge: 2016-09-08 | Disposition: A | Discharge status: Nursing Home (Includes ICF) | Payer: Federal, State, Local not specified - PPO | Attending: Emergency Medicine | Admitting: Emergency Medicine

## 2016-09-08 ENCOUNTER — Emergency Department (HOSPITAL_COMMUNITY): Payer: Federal, State, Local not specified - PPO

## 2016-09-08 ENCOUNTER — Encounter (HOSPITAL_COMMUNITY): Payer: Self-pay | Admitting: *Deleted

## 2016-09-08 ENCOUNTER — Encounter (HOSPITAL_COMMUNITY): Payer: Self-pay

## 2016-09-08 DIAGNOSIS — R0789 Other chest pain: Secondary | ICD-10-CM | POA: Insufficient documentation

## 2016-09-08 DIAGNOSIS — R519 Headache, unspecified: Secondary | ICD-10-CM

## 2016-09-08 DIAGNOSIS — R55 Syncope and collapse: Secondary | ICD-10-CM | POA: Diagnosis not present

## 2016-09-08 DIAGNOSIS — Y939 Activity, unspecified: Secondary | ICD-10-CM | POA: Insufficient documentation

## 2016-09-08 DIAGNOSIS — R509 Fever, unspecified: Secondary | ICD-10-CM | POA: Insufficient documentation

## 2016-09-08 DIAGNOSIS — R42 Dizziness and giddiness: Secondary | ICD-10-CM

## 2016-09-08 DIAGNOSIS — W1830XA Fall on same level, unspecified, initial encounter: Secondary | ICD-10-CM | POA: Diagnosis not present

## 2016-09-08 DIAGNOSIS — F17210 Nicotine dependence, cigarettes, uncomplicated: Secondary | ICD-10-CM | POA: Insufficient documentation

## 2016-09-08 DIAGNOSIS — Y929 Unspecified place or not applicable: Secondary | ICD-10-CM | POA: Insufficient documentation

## 2016-09-08 DIAGNOSIS — Y999 Unspecified external cause status: Secondary | ICD-10-CM | POA: Diagnosis not present

## 2016-09-08 DIAGNOSIS — R51 Headache: Secondary | ICD-10-CM | POA: Diagnosis not present

## 2016-09-08 DIAGNOSIS — R079 Chest pain, unspecified: Secondary | ICD-10-CM | POA: Diagnosis not present

## 2016-09-08 LAB — BASIC METABOLIC PANEL
ANION GAP: 9 (ref 5–15)
BUN: 10 mg/dL (ref 6–20)
CHLORIDE: 101 mmol/L (ref 101–111)
CO2: 28 mmol/L (ref 22–32)
Calcium: 9.6 mg/dL (ref 8.9–10.3)
Creatinine, Ser: 1.32 mg/dL — ABNORMAL HIGH (ref 0.61–1.24)
GFR calc Af Amer: >60 mL/min (ref >60)
GFR calc non Af Amer: >60 mL/min (ref >60)
GLUCOSE: 85 mg/dL (ref 65–99)
Potassium: 4 mmol/L (ref 3.5–5.1)
Sodium: 138 mmol/L (ref 135–145)

## 2016-09-08 LAB — CBC
HEMATOCRIT: 46.8 % (ref 39.0–52.0)
HEMOGLOBIN: 15.5 g/dL (ref 13.0–17.0)
MCH: 29.9 pg (ref 26.0–34.0)
MCHC: 33.1 g/dL (ref 30.0–36.0)
MCV: 90.3 fL (ref 78.0–100.0)
Platelets: 216 10*3/uL (ref 150–400)
RBC: 5.18 MIL/uL (ref 4.22–5.81)
RDW: 14.2 % (ref 11.5–15.5)
WBC: 5.1 10*3/uL (ref 4.0–10.5)

## 2016-09-08 LAB — URINALYSIS, ROUTINE W REFLEX MICROSCOPIC
Bilirubin Urine: NEGATIVE
GLUCOSE, UA: NEGATIVE mg/dL
Hgb urine dipstick: NEGATIVE
KETONES UR: NEGATIVE mg/dL
Leukocytes, UA: NEGATIVE
Nitrite: NEGATIVE
PH: 7 (ref 5.0–8.0)
Protein, ur: NEGATIVE mg/dL
SPECIFIC GRAVITY, URINE: 1.015 (ref 1.005–1.030)

## 2016-09-08 LAB — I-STAT TROPONIN, ED: TROPONIN I, POC: 0 ng/mL (ref 0.00–0.08)

## 2016-09-08 LAB — D-DIMER, QUANTITATIVE: D-Dimer, Quant: <0.27 ug/mL-FEU (ref 0.00–0.50)

## 2016-09-08 LAB — CBG MONITORING, ED
Glucose-Capillary: 71 mg/dL (ref 65–99)
Glucose-Capillary: 98 mg/dL (ref 65–99)

## 2016-09-08 MED ORDER — ACETAMINOPHEN 500 MG PO TABS
1000.0000 mg | ORAL_TABLET | Freq: Once | ORAL | Status: AC
  Administered 2016-09-08: 1000 mg via ORAL
  Filled 2016-09-08: qty 2

## 2016-09-08 MED ORDER — METOCLOPRAMIDE HCL 5 MG/ML IJ SOLN
10.0000 mg | Freq: Once | INTRAMUSCULAR | Status: AC
  Administered 2016-09-08: 10 mg via INTRAVENOUS
  Filled 2016-09-08: qty 2

## 2016-09-08 MED ORDER — DIPHENHYDRAMINE HCL 50 MG/ML IJ SOLN
25.0000 mg | Freq: Once | INTRAMUSCULAR | Status: AC
  Administered 2016-09-08: 25 mg via INTRAVENOUS
  Filled 2016-09-08: qty 1

## 2016-09-08 MED ORDER — SODIUM CHLORIDE 0.9 % IV BOLUS (SEPSIS)
1000.0000 mL | Freq: Once | INTRAVENOUS | Status: AC
  Administered 2016-09-08: 1000 mL via INTRAVENOUS

## 2016-09-08 NOTE — Discharge Instructions (Signed)
Please be sure to try to stay hydrated over the next few days. It appears that you have a fever today which could have contributed to the symptoms you are having today.

## 2016-09-08 NOTE — ED Triage Notes (Addendum)
Pt. Was walking down his steps and woke up in the middle of his steps.  Pt. Reports that his legs were up at the top of his steps and his head at the bottom of his steps.  He denies any injuries.  He is having chest pain into abdomen.  He also reports feeling foggy head.  Pt. Does not remember the incident.  Alert and oriented X3  Pt. Also reports having a sore throat and cough that started this morning.  ECG completed in Triage

## 2016-09-08 NOTE — ED Provider Notes (Signed)
I saw and evaluated the patient, reviewed the resident's note and I agree with the findings and plan.   EKG Interpretation  Date/Time:  Wednesday September 08 2016 14:25:17 EST Ventricular Rate:  70 PR Interval:  130 QRS Duration: 92 QT Interval:  362 QTC Calculation: 390 R Axis:   90 Text Interpretation:  Normal sinus rhythm with sinus arrhythmia Rightward axis Borderline ECG Confirmed by RAY MD, Duwayne HeckANIELLE [id number]) on 09/08/2016 4:18:7947 PM      26 year old male who presents with syncope. This morning woke up at 11 AM and walking down stairs when he passed out. Has been feeling lightheaded when standing up from seat position over past few days recently. Woke up with posterior headache and anterior chest wall pain, sharp in nature and worse with deep inspiration. With mild cough, congestion, sore throat as well over past few days, but no shortness of breath. No fevers or chills. Eating and drinking normally. No leg swelling, leg pain, recent immbolization, family history or personal history PE/DVT or family history of cardiac problems or sudden cardiac death.   Does have fever in ED 101.48F. Remainder of vital signs normal. He is neuro in tact. Cardiopulmonary exam unremarkable. Did initially get tachycardic on monitor with sitting up, likely orthostasis in setting of viral infection. CXR visualized and w/o acute cardiopulmonary process such as edema, infiltrate, PTX. ddimer negative and ruled out for PE. EKG with RAD, but no stigmata of arrythmia. CT visualized and negative for acute intracranial processes.  Felt stable for discharge home.   The patient appears reasonably screened and/or stabilized for discharge and I doubt any other medical condition or other [facility] requiring further screening, evaluation, or treatment in the ED at this time prior to discharge.  Strict return and follow-up instructions reviewed. He expressed understanding of all discharge instructions and felt comfortable with the plan  of care.    Lavera Guiseana Duo Nusrat Encarnacion, MD 09/08/16 Windell [id number]

## 2016-09-08 NOTE — ED Provider Notes (Signed)
CSN: [id number]     Arrival date & time 09/08/16  1232 History   First MD Initiated Contact with Patient 09/08/16 1411     Chief Complaint  Patient presents with  . Chest Pain   (Consider location/radiation/quality/duration/timing/severity/associated sxs/prior Treatment) Patient is a healthy 26 year old male, with no medical history, states that he was walking down steps at home and next thing he knew, he was on the floor. Patient believes that he passed out. This occurred 2 hours prior to arrival. After waking up, patient reports having chest pain that is constant, dizziness, feels "cloudy " and headache that is throbbing and patient remains symptomatic currently in room. Patient denies any cardiac histories. Patient denies taking any medications. Patient denies doing any street drugs. He also denies alcohol intake.       History reviewed. No pertinent past medical history. History reviewed. No pertinent surgical history. History reviewed. No pertinent family history. Social History  Substance Use Topics  . Smoking status: Current Every Day Smoker    Packs/day: 0.50    Types: Cigarettes  . Smokeless tobacco: Never Used  . Alcohol use Yes    Review of Systems  Constitutional: Positive for fatigue. Negative for fever.  Respiratory: Negative for shortness of breath and wheezing.   Cardiovascular: Positive for chest pain. Negative for palpitations.  Gastrointestinal: Negative for abdominal pain.  Neurological: Positive for dizziness and headaches.    Allergies  Patient has no known allergies.  Home Medications   Prior to Admission medications   Medication Sig Start Date End Date Taking? Authorizing Provider  Ibuprofen (ADVIL PO) Take by mouth.   Yes Historical Provider, MD  bacitracin ointment Apply 1 application topically 2 (two) times daily. 12/30/15   Everlene Farrier, PA-C   Meds Ordered and Administered this Visit  Medications - No data to display  BP 131/88   Pulse 82    Temp 98.8 F (37.1 C) (Oral)   Resp 18   SpO2 100%  No data found.   Physical Exam  Constitutional: He is oriented to person, place, and time.  Laying down on the exam table, patient appears tired  HENT:  Head: Normocephalic and atraumatic.  Nose: Nose normal.  Mouth/Throat: Oropharynx is clear and moist.  Eyes: Conjunctivae and EOM are normal. Pupils are equal, round, and reactive to light.  Neck: Normal range of motion. Neck supple.  Cardiovascular: Normal rate, regular rhythm, normal heart sounds and intact distal pulses.   No murmur heard. Pulmonary/Chest: Breath sounds normal. No respiratory distress. He has no wheezes.  Abdominal: Soft. Bowel sounds are normal. He exhibits no distension. There is no tenderness.  Musculoskeletal: Normal range of motion.  Neurological: He is alert and oriented to person, place, and time.  Skin: Skin is warm and dry.  Nursing note and vitals reviewed.   Urgent Care Course   Clinical Course     Procedures (including critical care time)  Labs Review Labs Reviewed - No data to display  Imaging Review No results found.  MDM   1. Syncope, unspecified syncope type   2. Chest pain, unspecified type   3. Dizziness   4. Nonintractable headache, unspecified chronicity pattern, unspecified headache type    Patient most likely had a syncopal episode with now having chest pain, dizziness and headache. Patient sent to the emergency department for further evaluation. Patient is stable to be transferred via own vehicle. Dad in room with patient; and is taking patient to the ER immediately.  Lucia EstelleFeng Hernando Reali, NP 09/08/16 1432

## 2016-09-08 NOTE — ED Provider Notes (Signed)
[facility] Provider Note   CSN: [id number] Arrival date & time: 09/08/16  1420     History   Chief Complaint Chief Complaint  Patient presents with  . Chest Pain  . Near Syncope    HPI Duane Oconnell is a 26 y.o. male.  The history is provided by the patient and medical records. No language interpreter was used.     26 year old male with no prior medical problems presenting today with syncope that occurred earlier this afternoon. Patient states she is about to walk down the steps when he next remembers waking up with his head at the top of the steps on a carpeted surface and is legs as if they were about to go down his chest. States that since then he's had a severe posterior throbbing headache. Denies any focal numbness or weakness. Has not had any recent fevers, chills, abdominal pain, dysuria, diarrhea. Denies any dark tarry stools or bloody stools. Denies any family history of sudden cardiac death or congenital or abnormal heart problems at an early age. Patient is not on any medications. Does endorse sharp chest pain in the center of his chest radiating down into the top of the stomach. This pain is been constant since the onset. No worsening or alleviating factors.  Patient sent here from urgent care for further evaluation.   No past medical history on file.  There are no active problems to display for this patient.   History reviewed. No pertinent surgical history.     Home Medications    Prior to Admission medications   Medication Sig Start Date End Date Taking? Authorizing Provider  bacitracin ointment Apply 1 application topically 2 (two) times daily. 12/30/15   Everlene Farrier, PA-C  Ibuprofen (ADVIL PO) Take by mouth.    Historical Provider, MD    Family History No family history on file.  Social History Social History  Substance Use Topics  . Smoking status: Current Every Day Smoker    Packs/day: 0.50    Types: Cigarettes  . Smokeless tobacco:  Never Used  . Alcohol use Yes     Allergies   Patient has no known allergies.   Review of Systems Review of Systems  Constitutional: Negative for chills and fever.  HENT: Negative for ear pain and sore throat.   Eyes: Negative for pain and visual disturbance.  Respiratory: Negative for cough and shortness of breath.   Cardiovascular: Positive for chest pain (sharp). Negative for palpitations.  Gastrointestinal: Positive for nausea. Negative for abdominal pain (radiating from chest), blood in stool, diarrhea and vomiting.  Genitourinary: Negative for dysuria and hematuria.  Musculoskeletal: Negative for arthralgias and back pain.  Skin: Negative for color change and rash.  Neurological: Positive for syncope, light-headedness (throughout today) and headaches (posterior headache). Negative for seizures.  All other systems reviewed and are negative.    Physical Exam Updated Vital Signs BP 142/90   Pulse 81   Temp 98.4 F (36.9 C) (Oral)   Resp 19   Ht 5\' 10"  (1.778 m)   Wt 65.3 kg   SpO2 98%   BMI 20.66 kg/m   Physical Exam  Constitutional: He appears well-developed and well-nourished. No distress.  HENT:  Head: Normocephalic and atraumatic.  Eyes: Conjunctivae are normal. Pupils are equal, round, and reactive to light.  Neck: Normal range of motion. Neck supple.  Cardiovascular: Normal rate and regular rhythm.   No murmur heard. Pulmonary/Chest: Effort normal and breath sounds normal. No respiratory distress.  Abdominal:  Soft. There is no tenderness.  Musculoskeletal: He exhibits no edema.  Neurological: He is alert. He has normal strength. No cranial nerve deficit (CN II-XII) or sensory deficit. Coordination (normal finger to nose bilaterally) and gait normal. GCS eye subscore is 4. GCS verbal subscore is 5. GCS motor subscore is 6.  Reflex Scores:      Patellar reflexes are 2+ on the right side and 2+ on the left side. Skin: Skin is warm and dry. He is not  diaphoretic.  Psychiatric: He has a normal mood and affect.  Nursing note and vitals reviewed.    ED Treatments / Results  Labs (all labs ordered are listed, but only abnormal results are displayed) Labs Reviewed  BASIC METABOLIC PANEL - Abnormal; Notable for the following:       Result Value   Creatinine, Ser 1.32 (*)    All other components within normal limits  CBC  URINALYSIS, ROUTINE W REFLEX MICROSCOPIC  D-DIMER, QUANTITATIVE (NOT AT [facility])  CBG MONITORING, ED  CBG MONITORING, ED  I-STAT TROPOININ, ED    EKG  EKG Interpretation  Date/Time:  Wednesday September 08 2016 14:25:17 EST Ventricular Rate:  70 PR Interval:  130 QRS Duration: 92 QT Interval:  362 QTC Calculation: 390 R Axis:   90 Text Interpretation:  Normal sinus rhythm with sinus arrhythmia Rightward axis Borderline ECG Confirmed by RAY MD, Duwayne HeckANIELLE ([id number]) on 09/08/2016 4:18:47 PM       Radiology Ct Head Wo Contrast  Result Date: 09/08/2016 CLINICAL DATA:  Syncopal episode with fall and LOC, posterior headache EXAM: CT HEAD WITHOUT CONTRAST TECHNIQUE: Contiguous axial images were obtained from the base of the skull through the vertex without intravenous contrast. COMPARISON:  None. FINDINGS: Brain: No evidence of acute infarction, hemorrhage, hydrocephalus, extra-axial collection or mass lesion/mass effect. Vascular: No hyperdense vessel or unexpected calcification. Skull: Normal. Negative for fracture or focal lesion. Sinuses/Orbits: No acute finding. Other: None IMPRESSION: No CT evidence for acute intracranial abnormality. Electronically Signed   By: Jasmine PangKim  Fujinaga M.D.   On: 09/08/2016 18:04   Dg Chest Portable 1 View  Result Date: 09/08/2016 CLINICAL DATA:  Syncopal episode with loss consciousness. Cough and sore throat. EXAM: PORTABLE CHEST 1 VIEW COMPARISON:  None. FINDINGS: The heart size and mediastinal contours are within normal limits. There is no evidence of pulmonary edema, consolidation,  pneumothorax, nodule or pleural fluid. The visualized skeletal structures are unremarkable. IMPRESSION: No active disease. Electronically Signed   By: Irish LackGlenn  Yamagata M.D.   On: 09/08/2016 18:29    Procedures Procedures (including critical care time)  Medications Ordered in ED Medications  sodium chloride 0.9 % bolus 1,000 mL (0 mLs Intravenous Stopped 09/08/16 1843)  metoCLOPramide (REGLAN) injection 10 mg (10 mg Intravenous Given 09/08/16 1740)  diphenhydrAMINE (BENADRYL) injection 25 mg (25 mg Intravenous Given 09/08/16 1741)  acetaminophen (TYLENOL) tablet 1,000 mg (1,000 mg Oral Given 09/08/16 1842)     Initial Impression / Assessment and Plan / ED Course  I have reviewed the triage vital signs and the nursing notes.  Pertinent labs & imaging results that were available during my care of the patient were reviewed by me and considered in my medical decision making (see chart for details).  Clinical Course     26 year old male presents today with syncope that occurred earlier today. Currently endorsing chest pain and headache. No obvious signs of trauma on my exam. He has no focal neurological deficits. Denies any family history or prior history of cardiac  issues. No ischemic changes are present on his EKG today.  Patient is notably tachycardic today and given his chest pain, I estimate his well's score to be low. Proceed with obtaining d-dimer. This is notably below the cutoff. This makes pulmonary emboli very unlikely. Also chest x-ray obtained does not demonstrate any evidence of a pneumonia. His urinalysis is negative.  While here patient was noted to have a fever. No focal findings to give a source of this fever but this could've contributed to the patient's development of syncope earlier today. He was given IV fluids here and was able to ambulate without getting lightheaded. Feel that he would be appropriate for discharge home with instructions for symptomatic care. Advised return here if  his symptoms were to return. Patient agreeable and discharged home in good condition.  Final Clinical Impressions(s) / ED Diagnoses   Final diagnoses:  Syncope, unspecified syncope type  Fever in adult    New Prescriptions Discharge Medication List as of 09/08/2016  7:02 PM       Madolyn Frieze, MD 09/08/16 1924    Lavera Guise, MD 09/09/16 1525

## 2016-09-08 NOTE — ED Notes (Signed)
Pt stable, understands discharge instructions, and reasons for return.   

## 2016-09-08 NOTE — ED Triage Notes (Signed)
Pt  Reports  He  Was going  Down  Steps   And   May     Passed  ot  Now  Reports  Pain in  Chest  Going  Down  Into  Stomach   C/o      sorethroat   Head   Feels  Cloudy        Otherwise  Is  Alert   denys   Any  specefic  Injury   Has  Felt  Dizzy  Before  But  Never  Passed  out

## 2016-11-22 ENCOUNTER — Ambulatory Visit (HOSPITAL_COMMUNITY)
Admission: EM | Admit: 2016-11-22 | Discharge: 2016-11-22 | Disposition: A | Discharge status: Home / Self Care | Payer: Federal, State, Local not specified - PPO | Attending: Family Medicine | Admitting: Family Medicine

## 2016-11-22 ENCOUNTER — Encounter (HOSPITAL_COMMUNITY): Payer: Self-pay | Admitting: Family Medicine

## 2016-11-22 DIAGNOSIS — B349 Viral infection, unspecified: Secondary | ICD-10-CM

## 2016-11-22 DIAGNOSIS — K529 Noninfective gastroenteritis and colitis, unspecified: Secondary | ICD-10-CM

## 2016-11-22 MED ORDER — ONDANSETRON 8 MG PO TBDP: 8.0000 mg | ORAL_TABLET | Freq: Three times a day (TID) | ORAL | 0 refills | Status: DC | PRN

## 2016-11-22 MED ORDER — IBUPROFEN 800 MG PO TABS: ORAL_TABLET | ORAL | 0 refills | Status: DC

## 2016-11-22 NOTE — ED Provider Notes (Signed)
CSN: [id number]     Arrival date & time 11/22/16  1044 History   First MD Initiated Contact with Patient 11/22/16 1139     Chief Complaint  Patient presents with  . Fever  . Emesis   (Consider location/radiation/quality/duration/timing/severity/associated sxs/prior Treatment) Patient c/o fever and nausea since last week approx 5 days ago.  He has not been able to eat and states he is drinking fluids but only a couple of glasses of water a day.   The history is provided by the patient.  Fever  Temp source:  Subjective Severity:  Moderate Onset quality:  Sudden Duration:  0 days Timing:  Constant Progression:  Waxing and waning Chronicity:  New Relieved by:  Nothing Worsened by:  Nothing Associated symptoms: vomiting   Emesis  Associated symptoms: fever     History reviewed. No pertinent past medical history. History reviewed. No pertinent surgical history. History reviewed. No pertinent family history. Social History  Substance Use Topics  . Smoking status: Current Every Day Smoker    Packs/day: 0.50    Types: Cigarettes  . Smokeless tobacco: Never Used  . Alcohol use Yes    Review of Systems  Constitutional: Positive for fever.  HENT: Negative.   Eyes: Negative.   Respiratory: Negative.   Cardiovascular: Negative.   Gastrointestinal: Positive for vomiting.  Endocrine: Negative.   Genitourinary: Negative.   Musculoskeletal: Negative.   Allergic/Immunologic: Negative.   Neurological: Negative.   Hematological: Negative.   Psychiatric/Behavioral: Negative.     Allergies  Patient has no known allergies.  Home Medications   Prior to Admission medications   Medication Sig Start Date End Date Taking? Authorizing Provider  bacitracin ointment Apply 1 application topically 2 (two) times daily. 12/30/15   Everlene FarrierWilliam Dansie, PA-C  Ibuprofen (ADVIL PO) Take by mouth.    Historical Provider, MD  ibuprofen (ADVIL,MOTRIN) 800 MG tablet Take one tablet every 8 hours as  necessary for fever or achiness 11/22/16   Deatra CanterWilliam J Bernard Donahoo, FNP  ondansetron (ZOFRAN ODT) 8 MG disintegrating tablet Take 1 tablet (8 mg total) by mouth every 8 (eight) hours as needed for nausea or vomiting. 11/22/16   Deatra CanterWilliam J Militza Devery, FNP   Meds Ordered and Administered this Visit  Medications - No data to display  BP 108/70   Pulse 89   Temp 99.1 F (37.3 C)   Resp 18   SpO2 95%  No data found.   Physical Exam  Constitutional: He is oriented to person, place, and time. He appears well-developed and well-nourished.  HENT:  Head: Normocephalic and atraumatic.  Right Ear: External ear normal.  Left Ear: External ear normal.  Mouth/Throat: Oropharynx is clear and moist.  Eyes: Conjunctivae and EOM are normal. Pupils are equal, round, and reactive to light.  Neck: Normal range of motion. Neck supple.  Cardiovascular: Normal rate, regular rhythm and normal heart sounds.   Pulmonary/Chest: Effort normal and breath sounds normal.  Abdominal: Soft. Bowel sounds are normal.  Neurological: He is alert and oriented to person, place, and time.  Nursing note and vitals reviewed.   Urgent Care Course     Procedures (including critical care time)  Labs Review Labs Reviewed - No data to display  Imaging Review No results found.   Visual Acuity Review  Right Eye Distance:   Left Eye Distance:   Bilateral Distance:    Right Eye Near:   Left Eye Near:    Bilateral Near:  MDM   1. Viral illness   2. Gastroenteritis    Push po fluids, rest, tylenol and motrin otc prn as directed for fever, arthralgias, and myalgias.  Follow up prn if sx's continue or persist.  Zofran otd 8mg  one po tid prn #21     Deatra Canter, FNP 11/22/16 1153

## 2016-11-22 NOTE — ED Triage Notes (Addendum)
Pt here for N,V since Wednesday. sts fever. Denies any respiratory symptoms. sts no BM. sts also head pain and throbbing in eye area. Vague abd pain .

## 2018-02-24 ENCOUNTER — Encounter (HOSPITAL_COMMUNITY): Payer: Self-pay

## 2018-02-24 ENCOUNTER — Ambulatory Visit (HOSPITAL_COMMUNITY)
Admission: EM | Admit: 2018-02-24 | Discharge: 2018-02-24 | Disposition: A | Discharge status: Home / Self Care | Payer: Self-pay | Attending: Family Medicine | Admitting: Family Medicine

## 2018-02-24 DIAGNOSIS — J029 Acute pharyngitis, unspecified: Secondary | ICD-10-CM | POA: Insufficient documentation

## 2018-02-24 DIAGNOSIS — Z711 Person with feared health complaint in whom no diagnosis is made: Secondary | ICD-10-CM

## 2018-02-24 DIAGNOSIS — Z113 Encounter for screening for infections with a predominantly sexual mode of transmission: Secondary | ICD-10-CM | POA: Insufficient documentation

## 2018-02-24 DIAGNOSIS — Z79899 Other long term (current) drug therapy: Secondary | ICD-10-CM | POA: Insufficient documentation

## 2018-02-24 DIAGNOSIS — Z791 Long term (current) use of non-steroidal anti-inflammatories (NSAID): Secondary | ICD-10-CM | POA: Insufficient documentation

## 2018-02-24 DIAGNOSIS — J3489 Other specified disorders of nose and nasal sinuses: Secondary | ICD-10-CM | POA: Insufficient documentation

## 2018-02-24 DIAGNOSIS — Z114 Encounter for screening for human immunodeficiency virus [HIV]: Secondary | ICD-10-CM | POA: Insufficient documentation

## 2018-02-24 DIAGNOSIS — F17210 Nicotine dependence, cigarettes, uncomplicated: Secondary | ICD-10-CM | POA: Insufficient documentation

## 2018-02-24 DIAGNOSIS — Z8249 Family history of ischemic heart disease and other diseases of the circulatory system: Secondary | ICD-10-CM | POA: Insufficient documentation

## 2018-02-24 DIAGNOSIS — H5711 Ocular pain, right eye: Secondary | ICD-10-CM | POA: Insufficient documentation

## 2018-02-24 MED ORDER — FLUTICASONE PROPIONATE 50 MCG/ACT NA SUSP
2.0000 | Freq: Every day | NASAL | 0 refills | Status: DC
Start: 2018-02-24 — End: 2018-09-26

## 2018-02-24 MED ORDER — IPRATROPIUM BROMIDE 0.06 % NA SOLN: 2.0000 | Freq: Four times a day (QID) | NASAL | 0 refills | Status: DC

## 2018-02-24 MED ORDER — POLYETHYL GLYCOL-PROPYL GLYCOL 0.4-0.3 % OP GEL: 1.0000 "application " | Freq: Every evening | OPHTHALMIC | 0 refills | Status: DC | PRN

## 2018-02-24 NOTE — ED Provider Notes (Signed)
[facility]    CSN: [id number] Arrival date & time: 02/24/18  1703     History   Chief Complaint Chief Complaint  Patient presents with  . Sore Throat  . Eye Pain    HPI Duane Oconnell is a 27 y.o. male.   27 year old male comes in for multiple complaints.  He has had 2-day history of right eye pain, 1 day history of sore throat.  No eye redness, training.  No crusting in the morning.  Denies any vision changes.  Does notice some sensitivity to light.  Denies injury/trauma to the eye.  States pain can be the back of the eye, worse with blowing his nose.  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  Wears glasses.  Has also had rhinorrhea, nasal congestion.  Mild intermittent cough.  Denies fever, chills, night sweats.  States he usually has sinus pressure due to seasonal allergies.  He is currently taking Claritin.  Current everyday smoker, 0.25 pack/day.  He would also like to have STD testing, HIV, syphilis.  States he usually does this every year, has not had one this year and would like to be tested.  He is asymptomatic.     History reviewed. No pertinent past medical history.  There are no active problems to display for this patient.   History reviewed. No pertinent surgical history.     Home Medications    Prior to Admission medications   Medication Sig Start Date End Date Taking? Authorizing Provider  bacitracin ointment Apply 1 application topically 2 (two) times daily. 12/30/15   Everlene Farrier, PA-C  fluticasone (FLONASE) 50 MCG/ACT nasal spray Place 2 sprays into both nostrils daily. 02/24/18   Cathie Hoops, Amy V, PA-C  Ibuprofen (ADVIL PO) Take by mouth.    [provider]  ibuprofen (ADVIL,MOTRIN) 800 MG tablet Take one tablet every 8 hours as necessary for fever or achiness 11/22/16   Deatra Canter, FNP  ipratropium (ATROVENT) 0.06 % nasal spray Place 2 sprays into both nostrils 4 (four) times daily. 02/24/18   Cathie Hoops, Amy V, PA-C    ondansetron (ZOFRAN ODT) 8 MG disintegrating tablet Take 1 tablet (8 mg total) by mouth every 8 (eight) hours as needed for nausea or vomiting. 11/22/16   Deatra Canter, FNP  Polyethyl Glycol-Propyl Glycol (SYSTANE) 0.4-0.3 % GEL ophthalmic gel Place 1 application into both eyes at bedtime as needed. 02/24/18   Belinda Fisher, PA-C    Family History Family History  Problem Relation Age of Onset  . Cancer Mother   . Hypertension Father     Social History Social History   Tobacco Use  . Smoking status: Current Every Day Smoker    Packs/day: 0.50    Types: Cigarettes  . Smokeless tobacco: Never Used  Substance Use Topics  . Alcohol use: Yes  . Drug use: Yes    Types: Marijuana     Allergies   Patient has no known allergies.   Review of Systems Review of Systems  Reason unable to perform ROS: See HPI as above.     Physical Exam Triage Vital Signs ED Triage Vitals  Enc Vitals Group     BP 02/24/18 1719 (!) 137/94     Pulse Rate 02/24/18 1719 76     Resp 02/24/18 1719 19     Temp 02/24/18 1719 98.7 F (37.1 C)     Temp src --      SpO2 02/24/18 1719 100 %  Weight --      Height --      Head Circumference --      Peak Flow --      Pain Score 02/24/18 1720 5     Pain Loc --      Pain Edu? --      Excl. in GC? --    No data found.  Updated Vital Signs BP (!) 137/94   Pulse 76   Temp 98.7 F (37.1 C)   Resp 19   SpO2 100%   Visual Acuity Right Eye Distance:   Left Eye Distance:   Bilateral Distance:    Right Eye Near: R Near: 20/40 Left Eye Near:  L Near: 20/25 Bilateral Near:  20/25  Physical Exam  Constitutional: He is oriented to person, place, and time. He appears well-developed and well-nourished.  Non-toxic appearance. He does not appear ill. No distress.  HENT:  Head: Normocephalic and atraumatic.  Right Ear: Tympanic membrane, external ear and ear canal normal. Tympanic membrane is not erythematous and not bulging.  Left Ear: Tympanic  membrane, external ear and ear canal normal. Tympanic membrane is not erythematous and not bulging.  Nose: Mucosal edema and rhinorrhea present. Right sinus exhibits frontal sinus tenderness. Right sinus exhibits no maxillary sinus tenderness. Left sinus exhibits no maxillary sinus tenderness and no frontal sinus tenderness.  Mouth/Throat: Uvula is midline and mucous membranes are normal. Posterior oropharyngeal erythema present. No tonsillar exudate.  Eyes: Pupils are equal, round, and reactive to light. Conjunctivae, EOM and lids are normal. Lids are everted and swept, no foreign bodies found.  No photophobia on exam.  Neck: Normal range of motion. Neck supple.  Cardiovascular: Normal rate, regular rhythm and normal heart sounds. Exam reveals no gallop and no friction rub.  No murmur heard. Pulmonary/Chest: Effort normal and breath sounds normal. He has no decreased breath sounds. He has no wheezes. He has no rhonchi. He has no rales.  Lymphadenopathy:    He has no cervical adenopathy.  Neurological: He is alert and oriented to person, place, and time.  Skin: Skin is warm and dry.  Psychiatric: He has a normal mood and affect. His behavior is normal. Judgment normal.     UC Treatments / Results  Labs (all labs ordered are listed, but only abnormal results are displayed) Labs Reviewed  HIV ANTIBODY (ROUTINE TESTING)  RPR  URINE CYTOLOGY ANCILLARY ONLY    EKG None  Radiology No results found.  Procedures Procedures (including critical care time)  Medications Ordered in UC Medications - No data to display  Initial Impression / Assessment and Plan / UC Course  I have reviewed the triage vital signs and the nursing notes.  Pertinent labs & imaging results that were available during my care of the patient were reviewed by me and considered in my medical decision making (see chart for details).    Will treat for sinus pressure with Flonase and Atrovent nasal spray.  Patient can  add on Benadryl for continued symptoms.  No alarming signs on eye exam.  Right eye pain possibly due to sinus pressure given pain behind the eye.  Artificial tear gel for possible dry eyes causing symptoms.  Lid scrub, warm compress.  Return precautions given.  Patient expresses understanding and agrees to plan.  Blood work and cytology sent, patient will be contacted with any positive results that require additional treatment. Patient to refrain from sexual activity for the next 7 days. Return precautions given.  Final Clinical Impressions(s) / UC Diagnoses   Final diagnoses:  Sinus pressure  Acute right eye pain  Concern about STD in male without diagnosis    ED Prescriptions    Medication Sig Dispense Auth. Provider   fluticasone (FLONASE) 50 MCG/ACT nasal spray Place 2 sprays into both nostrils daily. 1 g Yu, Amy V, PA-C   ipratropium (ATROVENT) 0.06 % nasal spray Place 2 sprays into both nostrils 4 (four) times daily. 15 mL Yu, Amy V, PA-C   Polyethyl Glycol-Propyl Glycol (SYSTANE) 0.4-0.3 % GEL ophthalmic gel Place 1 application into both eyes at bedtime as needed. 1 Bottle Threasa Alpha, [place] 02/24/18 1827

## 2018-02-24 NOTE — ED Triage Notes (Signed)
Pt presents with complaints of right eye pain and sore throat.

## 2018-02-24 NOTE — Discharge Instructions (Addendum)
Flonase and atrovent for nasal congestion/sinus pressure. Continue Claritin. You can add on benadryl at night if continues to have symptoms. Artificial tear gel at night. Lid scrubs and warm compresses as directed. Keep hydrated, your urine should be clear to pale yellow in color. Tylenol/motrin for fever and pain. Monitor for any worsening of symptoms, chest pain, shortness of breath, wheezing, swelling of the throat, follow up for reevaluation. Monitor for any worsening of symptoms, changes in vision, sensitivity to light, eye swelling, painful eye movement, follow up with ophthalmology for further evaluation.   Testing sent, you will be contacted with any positive results that requires further treatment. Refrain from sexual activity and alcohol use for the next 7 days. Monitor for any worsening of symptoms, fever, abdominal pain, nausea, vomiting, to follow up for reevaluation.

## 2018-02-25 LAB — HIV 1/2 AB DIFFERENTIATION
HIV 1 AB: POSITIVE — AB
HIV 2 Ab: NEGATIVE

## 2018-02-25 LAB — HIV ANTIBODY (ROUTINE TESTING W REFLEX): HIV Screen 4th Generation wRfx: REACTIVE — AB

## 2018-02-26 LAB — RPR: RPR: REACTIVE — AB

## 2018-02-26 LAB — RPR, QUANT+TP ABS (REFLEX)
Rapid Plasma Reagin, Quant: 1:2 {titer} — ABNORMAL HIGH
T Pallidum Abs: POSITIVE — AB

## 2018-02-27 ENCOUNTER — Telehealth (HOSPITAL_COMMUNITY): Payer: Self-pay | Admitting: Family Medicine

## 2018-02-27 LAB — URINE CYTOLOGY ANCILLARY ONLY
Chlamydia: NEGATIVE
NEISSERIA GONORRHEA: NEGATIVE
TRICH (WINDOWPATH): NEGATIVE

## 2018-02-27 NOTE — Telephone Encounter (Signed)
Called Duane Oconnell [phone] Left message for him to call the office [facility]

## 2018-02-28 ENCOUNTER — Telehealth (HOSPITAL_COMMUNITY): Payer: Self-pay | Admitting: Family Medicine

## 2018-02-28 NOTE — Telephone Encounter (Addendum)
I was able to reach Duane Oconnell by telephone this evening at 9 22 PM.  Identified by birthdate.  He states he had received my prior messages but had not called back because he did not feel well.  He has been home with sore throat and fever.  He is feeling better today.  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  I told him there are other test results that we need to discuss with him in person.  He agrees to come in tomorrow to be seen.  He was advised that he may call in the morning for an appointment.  He was told to register in as a follow-up.

## 2018-03-02 ENCOUNTER — Telehealth: Payer: Self-pay | Admitting: *Deleted

## 2018-03-02 NOTE — Telephone Encounter (Signed)
Faxed referral, demographics, lab results to [facility]. Andree CossHowell, Davionne Mastrangelo M, RN

## 2018-03-02 NOTE — Telephone Encounter (Signed)
-----   Message from Lurlean Leydenravis F Poole, [place] sent at 02/28/2018  1:54 PM EDT -----   ----- Message ----- From: Gardiner Barefootomer, Robert W, MD Sent: 02/25/2018   5:04 PM To: Lurlean Leydenravis F Poole, CMA  New positive.  thanks

## 2018-03-03 ENCOUNTER — Encounter (HOSPITAL_COMMUNITY): Payer: Self-pay | Admitting: Emergency Medicine

## 2018-03-03 ENCOUNTER — Telehealth (HOSPITAL_COMMUNITY): Payer: Self-pay

## 2018-03-03 ENCOUNTER — Ambulatory Visit (HOSPITAL_COMMUNITY)
Admission: EM | Admit: 2018-03-03 | Discharge: 2018-03-03 | Disposition: A | Discharge status: Home / Self Care | Payer: Self-pay | Attending: Family Medicine | Admitting: Family Medicine

## 2018-03-03 DIAGNOSIS — Z21 Asymptomatic human immunodeficiency virus [HIV] infection status: Secondary | ICD-10-CM

## 2018-03-03 DIAGNOSIS — B20 Human immunodeficiency virus [HIV] disease: Secondary | ICD-10-CM

## 2018-03-03 DIAGNOSIS — Z8619 Personal history of other infectious and parasitic diseases: Secondary | ICD-10-CM

## 2018-03-03 DIAGNOSIS — A528 Late syphilis, latent: Secondary | ICD-10-CM

## 2018-03-03 DIAGNOSIS — A539 Syphilis, unspecified: Secondary | ICD-10-CM

## 2018-03-03 HISTORY — DX: Human immunodeficiency virus (HIV) disease: B20

## 2018-03-03 MED ORDER — PENICILLIN G BENZATHINE 1200000 UNIT/2ML IM SUSP
INTRAMUSCULAR | Status: AC
  Filled 2018-03-03: qty 4

## 2018-03-03 MED ORDER — PENICILLIN G BENZATHINE 1200000 UNIT/2ML IM SUSP
2.4000 10*6.[IU] | Freq: Once | INTRAMUSCULAR | Status: AC
  Administered 2018-03-03: 2.4 10*6.[IU] via INTRAMUSCULAR

## 2018-03-03 NOTE — Telephone Encounter (Signed)
Pt reported to clinic today and was educated on results by Dr. Delton SeeNelson and was also treated for positive RPR.

## 2018-03-03 NOTE — Discharge Instructions (Addendum)
You have been treated for 1 infection. For the new diagnosis of HIV you need to go to the infectious disease doctor. Your first appointment will be with the financial assistant, pharmacist, laboratory testing and counselor.  You will then be scheduled for the physician follow-up.  You have an appointment Tuesday July 2 at 11:30 Please call if you cannot make it [phone]   It is important to avoid sexual encounter until you have been evaluated and treated

## 2018-03-03 NOTE — ED Triage Notes (Signed)
Pt here for treatment for syphilis  

## 2018-03-03 NOTE — ED Provider Notes (Signed)
[facility]    CSN: [id number] Arrival date & time: 03/03/18  [id number]     History   Chief Complaint Chief Complaint  Patient presents with  . Follow-up    HPI Duane Oconnell is a 27 y.o. male.   HPI  Duane Oconnell is here for follow-up.  I have been calling him at home and requesting that he come back in to discuss his recent test results.  He was seen in the clinic on February 24, 2018 for viral upper respiratory symptoms.  At that time he requested HIV and RPR testing.  He states he gets them yearly.  He was asymptomatic.  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  I informed him of his positive syphilis test over the phone told him to come in for treatment, and for discussion about other test results that have been received. He is here today.  He states he continues to feel well.  He has no lesion.  No dysuria.  No fatigue.  He does admit that he is a man who has sex with men without protection or condoms. He received the news that he is HIV well.  We did discussion about the current state of HIV treatment.  He agrees to go to an infectious disease clinic for additional testing and treatment.  I expressed upon him the urgency of abstinence until he is under treatment.  I explained that this is a chronic disease or require management for the rest of his life.  He expressed concerns about his financial situation.  I reassured him that this would be taking care of at the Center for infectious diseases.  Financial assistance is available.  I told him that these tests would be reported to the health department.  Health department will be in touch with him regarding his sexual contacts.  His contacts will be notified, tested, and treated if appropriate.   Past Medical History:  Diagnosis Date  . HIV (human immunodeficiency virus infection) [facility](HCC)     Patient Active Problem List   Diagnosis Date Noted  . Asymptomatic HIV infection (HCC) 03/03/2018  . Syphilis 03/03/2018     History reviewed. No pertinent surgical history.     Home Medications    Prior to Admission medications   Medication Sig Start Date End Date Taking? Authorizing Provider  fluticasone (FLONASE) 50 MCG/ACT nasal spray Place 2 sprays into both nostrils daily. 02/24/18   Belinda FisherYu, Amy V, PA-C  ibuprofen (ADVIL,MOTRIN) 800 MG tablet Take one tablet every 8 hours as necessary for fever or achiness 11/22/16   Deatra Canterxford, William J, FNP  ipratropium (ATROVENT) 0.06 % nasal spray Place 2 sprays into both nostrils 4 (four) times daily. 02/24/18   Cathie HoopsYu, Amy V, PA-C  Polyethyl Glycol-Propyl Glycol (SYSTANE) 0.4-0.3 % GEL ophthalmic gel Place 1 application into both eyes at bedtime as needed. 02/24/18   Belinda FisherYu, Amy V, PA-C    Family History Family History  Problem Relation Age of Onset  . Cancer Mother   . Hypertension Father     Social History Social History   Tobacco Use  . Smoking status: Current Every Day Smoker    Packs/day: 0.50    Types: Cigarettes  . Smokeless tobacco: Never Used  Substance Use Topics  . Alcohol use: Yes  . Drug use: Yes    Types: Marijuana     Allergies   Patient has no known allergies.   Review of Systems Review of Systems   Physical Exam  Triage Vital Signs ED Triage Vitals  Enc Vitals Group     BP 03/03/18 1009 130/86     Pulse Rate 03/03/18 1009 77     Resp 03/03/18 1009 16     Temp 03/03/18 1009 98.8 F (37.1 C)     Temp Source 03/03/18 1009 Oral     SpO2 03/03/18 1009 99 %     Weight --      Height --      Head Circumference --      Peak Flow --      Pain Score 03/03/18 1018 0     Pain Loc --      Pain Edu? --      Excl. in GC? --    No data found.  Updated Vital Signs BP 130/86 (BP Location: Left Arm)   Pulse 77   Temp 98.8 F (37.1 C) (Oral)   Resp 16   SpO2 99%      Physical Exam  Constitutional: He appears well-developed and well-nourished. No distress.  HENT:  Head: Normocephalic and atraumatic.  Mouth/Throat: Oropharynx is  clear and moist.  Eyes: Pupils are equal, round, and reactive to light. Conjunctivae are normal.  Neck: Normal range of motion.  Cardiovascular: Normal rate.  Pulmonary/Chest: Effort normal. No respiratory distress.  Abdominal: Soft. He exhibits no distension.  Genitourinary:  Genitourinary Comments: Declines exam  Musculoskeletal: Normal range of motion. He exhibits no edema.  Neurological: He is alert.  Skin: Skin is warm and dry.  Psychiatric: He has a normal mood and affect. His behavior is normal.  Quiet demeanor.     UC Treatments / Results  Labs (all labs ordered are listed, but only abnormal results are displayed) Labs Reviewed - No data to display  EKG None  Radiology No results found.  Procedures Procedures (including critical care time)  Medications Ordered in UC Medications  penicillin g benzathine (BICILLIN LA) 1200000 UNIT/2ML injection 2.4 Million Units (2.4 Million Units Intramuscular Given 03/03/18 1044)    Initial Impression / Assessment and Plan / UC Course  I have reviewed the triage vital signs and the nursing notes.  Pertinent labs & imaging results that were available during my care of the patient were reviewed by me and considered in my medical decision making (see chart for details).    I phoned the regional center for infectious disease.  I obtained for him an appointment on Tuesday.  I gave him the appointment time did not address and phone number.  I urged him to attend this appointment.  If he is not able to attend then he needs to call in advance.  If he is not able to get transportation, he also will need to call in advance for assistance. Greater than 50% of this visit was spent in counseling and coordinating care.  Total face to face time:   40 minutes.  Much of this is spent in discussing HIV and his necessary steps to treat, preventive, and live with this disease.  All questions were answered.  Final Clinical Impressions(s) / UC Diagnoses    Final diagnoses:  Newly diagnosed HIV-positive [facility])  Syphilis     Discharge Instructions     You have been treated for 1 infection. For the new diagnosis of HIV you need to go to the infectious disease doctor. Your first appointment will be with the financial assistant, pharmacist, laboratory testing and counselor.  You will then be scheduled for the physician follow-up.  You have  an appointment Tuesday July 2 at 11:30 Please call if you cannot make it [phone]   It is important to avoid sexual encounter until you have been evaluated and treated      ED Prescriptions    None     Controlled Substance Prescriptions [place] Controlled Substance Registry consulted? Not Applicable  Eustace Moore, MD 03/03/18 1228

## 2018-03-07 ENCOUNTER — Other Ambulatory Visit: Payer: Self-pay | Admitting: Behavioral Health

## 2018-03-07 ENCOUNTER — Other Ambulatory Visit: Payer: Self-pay

## 2018-03-07 ENCOUNTER — Ambulatory Visit: Payer: Self-pay

## 2018-03-07 DIAGNOSIS — Z79899 Other long term (current) drug therapy: Secondary | ICD-10-CM

## 2018-03-07 DIAGNOSIS — B20 Human immunodeficiency virus [HIV] disease: Secondary | ICD-10-CM

## 2018-03-07 DIAGNOSIS — Z113 Encounter for screening for infections with a predominantly sexual mode of transmission: Secondary | ICD-10-CM

## 2018-03-07 NOTE — Addendum Note (Signed)
Addended by: Andree CossHOWELL, MICHELLE M on: 03/07/2018 09:50 AM   Modules accepted: Orders

## 2018-03-08 ENCOUNTER — Other Ambulatory Visit: Payer: Self-pay

## 2018-03-08 DIAGNOSIS — B20 Human immunodeficiency virus [HIV] disease: Secondary | ICD-10-CM

## 2018-03-08 DIAGNOSIS — Z79899 Other long term (current) drug therapy: Secondary | ICD-10-CM

## 2018-03-08 DIAGNOSIS — Z113 Encounter for screening for infections with a predominantly sexual mode of transmission: Secondary | ICD-10-CM

## 2018-03-09 LAB — URINALYSIS
BILIRUBIN URINE: NEGATIVE
Glucose, UA: NEGATIVE
Hgb urine dipstick: NEGATIVE
Ketones, ur: NEGATIVE
Leukocytes, UA: NEGATIVE
Nitrite: NEGATIVE
PH: 5.5 (ref 5.0–8.0)
Protein, ur: NEGATIVE
SPECIFIC GRAVITY, URINE: 1.024 (ref 1.001–1.03)

## 2018-03-10 LAB — T-HELPER CELL (CD4) - (RCID CLINIC ONLY)
CD4 % Helper T Cell: 12 % — ABNORMAL LOW (ref 33–55)
CD4 T Cell Abs: 180 /uL — ABNORMAL LOW (ref 400–2700)

## 2018-03-11 LAB — QUANTIFERON-TB GOLD PLUS
Mitogen-NIL: >10 IU/mL
NIL: 0.05 IU/mL
QUANTIFERON-TB GOLD PLUS: NEGATIVE
TB1-NIL: 0 [IU]/mL

## 2018-03-13 LAB — LIPID PANEL
Cholesterol: 186 mg/dL (ref <200)
HDL: 51 mg/dL (ref >40)
LDL CHOLESTEROL (CALC): 117 mg/dL — AB
NON-HDL CHOLESTEROL (CALC): 135 mg/dL — AB (ref <130)
TRIGLYCERIDES: 84 mg/dL (ref <150)
Total CHOL/HDL Ratio: 3.6 (calc) (ref <5.0)

## 2018-03-13 LAB — HEPATITIS B SURFACE ANTIGEN: HEP B S AG: NONREACTIVE

## 2018-03-13 LAB — COMPLETE METABOLIC PANEL WITH GFR
AG RATIO: 1.2 (calc) (ref 1.0–2.5)
ALT: 19 U/L (ref 9–46)
AST: 19 U/L (ref 10–40)
Albumin: 4.2 g/dL (ref 3.6–5.1)
Alkaline phosphatase (APISO): 89 U/L (ref 40–115)
BILIRUBIN TOTAL: 0.3 mg/dL (ref 0.2–1.2)
BUN: 12 mg/dL (ref 7–25)
CHLORIDE: 102 mmol/L (ref 98–110)
CO2: 29 mmol/L (ref 20–32)
Calcium: 9.4 mg/dL (ref 8.6–10.3)
Creat: 1.11 mg/dL (ref 0.60–1.35)
GFR, EST AFRICAN AMERICAN: 105 mL/min/{1.73_m2} (ref >=60)
GFR, Est Non African American: 91 mL/min/{1.73_m2} (ref >=60)
GLUCOSE: 89 mg/dL (ref 65–99)
Globulin: 3.6 g/dL (calc) (ref 1.9–3.7)
POTASSIUM: 4.7 mmol/L (ref 3.5–5.3)
Sodium: 139 mmol/L (ref 135–146)
Total Protein: 7.8 g/dL (ref 6.1–8.1)

## 2018-03-13 LAB — HEPATITIS B SURFACE ANTIBODY,QUALITATIVE: Hep B S Ab: NONREACTIVE

## 2018-03-13 LAB — HEPATITIS B CORE ANTIBODY, TOTAL: Hep B Core Total Ab: NONREACTIVE

## 2018-03-13 LAB — CBC WITH DIFFERENTIAL/PLATELET
BASOS ABS: 19 {cells}/uL (ref 0–200)
Basophils Relative: 0.6 %
Eosinophils Absolute: 48 cells/uL (ref 15–500)
Eosinophils Relative: 1.5 %
HCT: 44.4 % (ref 38.5–50.0)
HEMOGLOBIN: 14.9 g/dL (ref 13.2–17.1)
Lymphs Abs: 1536 cells/uL (ref 850–3900)
MCH: 29.2 pg (ref 27.0–33.0)
MCHC: 33.6 g/dL (ref 32.0–36.0)
MCV: 86.9 fL (ref 80.0–100.0)
MONOS PCT: 9.6 %
MPV: 10.8 fL (ref 7.5–12.5)
NEUTROS ABS: 1290 {cells}/uL — AB (ref 1500–7800)
Neutrophils Relative %: 40.3 %
Platelets: 272 10*3/uL (ref 140–400)
RBC: 5.11 10*6/uL (ref 4.20–5.80)
RDW: 13.1 % (ref 11.0–15.0)
Total Lymphocyte: 48 %
WBC mixed population: 307 cells/uL (ref 200–950)
WBC: 3.2 10*3/uL — AB (ref 3.8–10.8)

## 2018-03-13 LAB — HEPATITIS C ANTIBODY
Hepatitis C Ab: NONREACTIVE
SIGNAL TO CUT-OFF: 0.1 (ref <1.00)

## 2018-03-13 LAB — HEPATITIS A ANTIBODY, TOTAL: Hepatitis A AB,Total: NONREACTIVE

## 2018-03-13 LAB — HLA B*5701: HLA-B*5701 w/rflx HLA-B High: NEGATIVE

## 2018-03-18 LAB — HIV-1 GENOTYPE: HIV-1 Genotype: DETECTED — AB

## 2018-03-18 LAB — HIV-1 RNA ULTRAQUANT REFLEX TO GENTYP+
HIV 1 RNA QUANT: 34000 {copies}/mL — AB
HIV-1 RNA Quant, Log: 4.53 Log copies/mL — ABNORMAL HIGH

## 2018-03-27 ENCOUNTER — Ambulatory Visit: Payer: Self-pay

## 2018-03-27 ENCOUNTER — Ambulatory Visit: Payer: Self-pay | Admitting: Infectious Diseases

## 2018-03-28 ENCOUNTER — Telehealth: Payer: Self-pay | Admitting: *Deleted

## 2018-03-28 NOTE — Telephone Encounter (Signed)
Patient no-showed his appointment at [facility] to establish HIV care. RN reached out, patient stated he forgot about the appointment after he returned home from [place].  Patient rescheduled with front desk for 8/2. Andree CossHowell, Mette [place] M, RN

## 2018-03-28 NOTE — Telephone Encounter (Signed)
Wonderful!  Thank you so much!

## 2018-03-29 ENCOUNTER — Encounter: Payer: Self-pay | Admitting: Infectious Diseases

## 2018-04-07 ENCOUNTER — Ambulatory Visit: Payer: Self-pay

## 2018-04-07 ENCOUNTER — Ambulatory Visit: Payer: Self-pay | Admitting: Infectious Diseases

## 2018-06-27 ENCOUNTER — Ambulatory Visit: Payer: Self-pay | Admitting: Infectious Diseases

## 2018-07-24 DIAGNOSIS — Z0000 Encounter for general adult medical examination without abnormal findings: Secondary | ICD-10-CM | POA: Insufficient documentation

## 2018-07-24 NOTE — Progress Notes (Signed)
Name: Duane Oconnell  DOB: 01/30/1991 MRN: [id number] PCP: Patient, No Pcp Per    Patient Active Problem List   Diagnosis Date Noted  . Healthcare maintenance 07/24/2018  . Asymptomatic HIV infection ([place]) 03/03/2018  . History of syphilis 03/03/2018     Brief Narrative:  Duane Oconnell is a 27 y.o. male with HIV infection. Originally diagnosed 02-24-18. History of OIs: none. CD4 nadir 180. HIV Risk: MSM.   Previous Regimens: . naive  Genotypes: . 03/2018 - no mutations   Subjective:  CC:  New HIV patient to establish care. No complaints. Ready to start medications.   HPI/ROS:  He feels well overall and has no symptoms that are concerning to his health. He was diagnosed with HIV during testing at the [facility] in June and has not been seen by a provider or started on medications yet. He has looked into a little bit about his condition and has a basic knowledge about it. Reports no complaints today suggestive of associated opportunistic infection or advancing HIV disease such as fevers, night sweats, weight loss, anorexia, cough, SOB, nausea, vomiting, diarrhea, headache, sensory changes, lymphadenopathy or oral thrush. He is not currently sexually active. Prefers male partners and he participates in versatile insertive/receptive sex. Condom use intermittent. Has been treated for syphilis this year.   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 No significant past medical history. Has had some high blood pressure readings in the past. Vaccines as a child per recommendations as far as he knows. He has a significant family history of cancer in younger brother and mother.  Review of Systems  Constitutional: Negative for chills, fever, malaise/fatigue and weight loss.  HENT: Negative for sore throat.        No dental problems  Respiratory: Negative for cough and sputum production.   Cardiovascular: Negative for chest  pain and leg swelling.  Gastrointestinal: Negative for abdominal pain, diarrhea and vomiting.  Genitourinary: Negative for dysuria and flank pain.  Musculoskeletal: Negative for joint pain, myalgias and neck pain.  Skin: Negative for rash.  Neurological: Negative for dizziness, tingling and headaches.  Psychiatric/Behavioral: Negative for depression and substance abuse. The patient is not nervous/anxious and does not have insomnia.     Past Medical History:  Diagnosis Date  . HIV (human immunodeficiency virus infection) ([place])     Outpatient Medications Prior to Visit  Medication Sig Dispense Refill  . fluticasone (FLONASE) 50 MCG/ACT nasal spray Place 2 sprays into both nostrils daily. (Patient not taking: Reported on 07/25/2018) 1 g 0  . ibuprofen (ADVIL,MOTRIN) 800 MG tablet Take one tablet every 8 hours as necessary for fever or achiness (Patient not taking: Reported on 07/25/2018) 21 tablet 0  . ipratropium (ATROVENT) 0.06 % nasal spray Place 2 sprays into both nostrils 4 (four) times daily. (Patient not taking: Reported on 07/25/2018) 15 mL 0  . Polyethyl Glycol-Propyl Glycol (SYSTANE) 0.4-0.3 % GEL ophthalmic gel Place 1 application into both eyes at bedtime as needed. (Patient not taking: Reported on 07/25/2018) 1 Bottle 0   No facility-administered medications prior to visit.      No Known Allergies  Social History   Tobacco Use  . Smoking status: Current Every Day Smoker    Packs/day: 0.25    Types: Cigarettes  . Smokeless tobacco: Never Used  Substance Use Topics  . Alcohol use: Yes    Comment: occasionally, 1-2 drinks a week   .  Drug use: Yes    Types: Marijuana    Family History  Problem Relation Age of Onset  . Cancer Mother        bile duct cancer   . Hypertension Father   . Brain cancer Brother     Social History   Substance and Sexual Activity  Sexual Activity Not Currently  . Partners: Male  . Birth control/protection: Condom     Objective:    Vitals:   07/25/18 0944  BP: (!) 153/96  Pulse: 86  Temp: 98.8 F (37.1 C)  TempSrc: Oral  Weight: 141 lb (64 kg)   Body mass index is 20.23 kg/m.  Physical Exam  Constitutional: He is oriented to person, place, and time. He appears well-developed and well-nourished.  Thin appearing. Non-toxic. Comfortable.   HENT:  Mouth/Throat: Oropharynx is clear and moist.  Eyes: Pupils are equal, round, and reactive to light. No scleral icterus.  Neck: Normal range of motion. No thyromegaly present.  Cardiovascular: Normal rate, regular rhythm and normal heart sounds.  No murmur heard. Pulmonary/Chest: Effort normal and breath sounds normal. No respiratory distress. He has no wheezes. He has no rales.  Abdominal: Soft. Bowel sounds are normal. He exhibits no distension. There is no tenderness.  Musculoskeletal: Normal range of motion.  Lymphadenopathy:    He has no cervical adenopathy.  Neurological: He is alert and oriented to person, place, and time.  Skin: Skin is warm and dry. Capillary refill takes less than 2 seconds. No rash noted.  Psychiatric: He has a normal mood and affect. His behavior is normal. Judgment and thought content normal.  Vitals reviewed.   Lab Results Lab Results  Component Value Date   WBC 3.2 (L) 03/08/2018   HGB 14.9 03/08/2018   HCT 44.4 03/08/2018   MCV 86.9 03/08/2018   PLT 272 03/08/2018    Lab Results  Component Value Date   CREATININE 1.11 03/08/2018   BUN 12 03/08/2018   NA 139 03/08/2018   K 4.7 03/08/2018   CL 102 03/08/2018   CO2 29 03/08/2018    Lab Results  Component Value Date   ALT 19 03/08/2018   AST 19 03/08/2018   ALKPHOS 109 11/04/2014   BILITOT 0.3 03/08/2018    Lab Results  Component Value Date   CHOL 186 03/08/2018   HDL 51 03/08/2018   LDLCALC 117 (H) 03/08/2018   TRIG 84 03/08/2018   CHOLHDL 3.6 03/08/2018   HIV 1 RNA Quant (copies/mL)  Date Value  03/08/2018 34,000 (H)   CD4 T Cell Abs (/uL)  Date Value   03/08/2018 180 (L)     Assessment & Plan:   Problem List Items Addressed This Visit      Unprioritized   Asymptomatic HIV infection ([place]) - Primary    New patient here to establish for HIV care. I discussed with Duane Oconnell treatment options/side effects, benefits of treatment and long-term outcomes. I discussed how HIV is transmitted and the process of untreated HIV including increased risk for opportunistic infections, cancer, dementia and renal failure. Patient was counseled on routine HIV care including medication adherence, blood monitoring, necessary vaccines and follow up visits. Counseled regarding safe sex practices including: condom use, partner disclosure, limiting partners. Patient spent time talking with our pharmacist Cassie regarding successful practices of ART and understands to reach out to our clinic in the future with questions.   Will start Cana for HIV treatment as well as BACTRIM for OI prophylaxisis considering last known  CD4 4 months ago met AIDS criteria @ 180. No findings concerning for opportunistic infections today. He will receive medications from [facility] program, which has been approved. Discussed that he will need to make an appointment in January of 2020 to re-enroll. Discussed intervals of re-application and briefly about formulary. Will repeat VL/CD4 to get updated lab work.   General introduction to our clinic and integrated services. He was introduced to [place] with [facility] today. Rollene Fare was with a client but discussed her role/service.   I spent greater than 45 minutes with the patient today. Greater than 50% of the time spent face-to-face counseling and coordination of care re: HIV and health maintenance.        Relevant Medications   bictegravir-emtricitabine-tenofovir AF (BIKTARVY) 50-200-25 MG TABS tablet   sulfamethoxazole-trimethoprim (BACTRIM DS,SEPTRA DS) 800-160 MG tablet   Other Relevant Orders   HIV-1 RNA quant-no reflex-bld   T-helper cell  (CD4)- ([facility] clinic only)   Healthcare maintenance    Vaccines for PLWH discussed. Declined flu shot today. He may consider Prevnar at upcoming appointment with pharmacy team in 4 weeks. Pneumovax 8w after per CDC guidelines. He will need Hep A an B series, Menveo and HPV if not already vaccinated. Will check [place] Database.       History of syphilis   Relevant Orders   RPR (Completed)      Janene Madeira, MSN, NP-C [facility] [place] Pager: [phone] Office: [phone]  07/26/18  12:41 PM

## 2018-07-25 ENCOUNTER — Ambulatory Visit (INDEPENDENT_AMBULATORY_CARE_PROVIDER_SITE_OTHER): Payer: Self-pay | Admitting: Infectious Diseases

## 2018-07-25 ENCOUNTER — Encounter: Payer: Self-pay | Admitting: Infectious Diseases

## 2018-07-25 ENCOUNTER — Ambulatory Visit: Payer: Self-pay

## 2018-07-25 VITALS — BP 153/96 | HR 86 | Temp 98.8°F | Wt 141.0 lb

## 2018-07-25 DIAGNOSIS — Z0000 Encounter for general adult medical examination without abnormal findings: Secondary | ICD-10-CM

## 2018-07-25 DIAGNOSIS — Z21 Asymptomatic human immunodeficiency virus [HIV] infection status: Secondary | ICD-10-CM

## 2018-07-25 DIAGNOSIS — Z8619 Personal history of other infectious and parasitic diseases: Secondary | ICD-10-CM

## 2018-07-25 MED ORDER — BICTEGRAVIR-EMTRICITAB-TENOFOV 50-200-25 MG PO TABS: 1.0000 | ORAL_TABLET | Freq: Every day | ORAL | 11 refills | Status: DC

## 2018-07-25 MED ORDER — SULFAMETHOXAZOLE-TRIMETHOPRIM 800-160 MG PO TABS: 1.0000 | ORAL_TABLET | Freq: Every day | ORAL | 3 refills | Status: DC

## 2018-07-25 NOTE — Patient Instructions (Addendum)
Nice to meet you!   Will need to start you on 2 medications  One is for your HIV called Biktarvy.  Biktarvy is the pill for your HIV infection - this will need to be taken once a day around the same time.  - Common side effects for a short time frame usually include headaches, nausea and diarrhea - OK to take over the counter tylenol for headaches and imodium for diarrhea - Try taking with food if you are nauseated  - Please separate by 6 hours your multivitamins or supplements.   Second medication is called Bactrim - this is an antibiotic I want you to take once a day until we tell you to stop.   Please schedule an appointment with our financial coordinator in January to reapply for insurance to cover your medications and doctors visits here.   Will have you follow up with pharmacy team in 1 month and Judeth CornfieldStephanie again in 2 months.

## 2018-07-26 LAB — T-HELPER CELL (CD4) - (RCID CLINIC ONLY)
CD4 % Helper T Cell: 12 % — ABNORMAL LOW (ref 33–55)
CD4 T Cell Abs: 230 /uL — ABNORMAL LOW (ref 400–2700)

## 2018-07-26 NOTE — Assessment & Plan Note (Addendum)
Vaccines for PLWH discussed. Declined flu shot today. He may consider Prevnar at upcoming appointment with pharmacy team in 4 weeks. Pneumovax 8w after per CDC guidelines. He will need Hep A an B series, Menveo and HPV if not already vaccinated. Will check [place] Database.

## 2018-07-26 NOTE — Assessment & Plan Note (Addendum)
New patient here to establish for HIV care. I discussed with Sabastion Moscoso treatment options/side effects, benefits of treatment and long-term outcomes. I discussed how HIV is transmitted and the process of untreated HIV including increased risk for opportunistic infections, cancer, dementia and renal failure. Patient was counseled on routine HIV care including medication adherence, blood monitoring, necessary vaccines and follow up visits. Counseled regarding safe sex practices including: condom use, partner disclosure, limiting partners. Patient spent time talking with our pharmacist Cassie regarding successful practices of ART and understands to reach out to our clinic in the future with questions.   Will start BIKTARVY for HIV treatment as well as BACTRIM for OI prophylaxisis considering last known CD4 4 months ago met AIDS criteria @ 180. No findings concerning for opportunistic infections today. He will receive medications from [facility] program, which has been approved. Discussed that he will need to make an appointment in January of 2020 to re-enroll. Discussed intervals of re-application and briefly about formulary. Will repeat VL/CD4 to get updated lab work.   General introduction to our clinic and integrated services. He was introduced to [place] with [facility] today. Regina was with a client but discussed her role/service.   I spent greater than 45 minutes with the patient today. Greater than 50% of the time spent face-to-face counseling and coordination of care re: HIV and health maintenance.   

## 2018-07-27 LAB — HIV-1 RNA QUANT-NO REFLEX-BLD
HIV 1 RNA QUANT: 16400 {copies}/mL — AB
HIV-1 RNA Quant, Log: 4.21 Log copies/mL — ABNORMAL HIGH

## 2018-07-27 LAB — FLUORESCENT TREPONEMAL AB(FTA)-IGG-BLD: FLUORESCENT TREPONEMAL ABS: REACTIVE — AB

## 2018-07-27 LAB — RPR TITER: RPR Titer: 1:2 {titer} — ABNORMAL HIGH

## 2018-07-27 LAB — RPR: RPR Ser Ql: REACTIVE — AB

## 2018-08-08 ENCOUNTER — Encounter: Payer: Self-pay | Admitting: Infectious Diseases

## 2018-09-26 ENCOUNTER — Emergency Department (HOSPITAL_COMMUNITY): Payer: Self-pay

## 2018-09-26 ENCOUNTER — Encounter (HOSPITAL_COMMUNITY): Payer: Self-pay | Admitting: Emergency Medicine

## 2018-09-26 ENCOUNTER — Emergency Department (HOSPITAL_COMMUNITY)
Admission: EM | Admit: 2018-09-26 | Discharge: 2018-09-26 | Disposition: A | Discharge status: Home / Self Care | Payer: Self-pay | Attending: Emergency Medicine | Admitting: Emergency Medicine

## 2018-09-26 DIAGNOSIS — J029 Acute pharyngitis, unspecified: Secondary | ICD-10-CM | POA: Insufficient documentation

## 2018-09-26 DIAGNOSIS — B20 Human immunodeficiency virus [HIV] disease: Secondary | ICD-10-CM | POA: Insufficient documentation

## 2018-09-26 DIAGNOSIS — Z79899 Other long term (current) drug therapy: Secondary | ICD-10-CM | POA: Insufficient documentation

## 2018-09-26 DIAGNOSIS — R05 Cough: Secondary | ICD-10-CM | POA: Insufficient documentation

## 2018-09-26 DIAGNOSIS — F17210 Nicotine dependence, cigarettes, uncomplicated: Secondary | ICD-10-CM | POA: Insufficient documentation

## 2018-09-26 DIAGNOSIS — R509 Fever, unspecified: Secondary | ICD-10-CM | POA: Insufficient documentation

## 2018-09-26 DIAGNOSIS — R6889 Other general symptoms and signs: Secondary | ICD-10-CM

## 2018-09-26 MED ORDER — OSELTAMIVIR PHOSPHATE 75 MG PO CAPS: 75.0000 mg | ORAL_CAPSULE | Freq: Two times a day (BID) | ORAL | 0 refills | Status: DC

## 2018-09-26 MED ORDER — ACETAMINOPHEN 325 MG PO TABS
650.0000 mg | ORAL_TABLET | Freq: Once | ORAL | Status: AC
  Administered 2018-09-26: 650 mg via ORAL
  Filled 2018-09-26: qty 2

## 2018-09-26 NOTE — ED Notes (Signed)
Pt verbalized understanding of discharge paperwork, prescriptions, and follow-up care. 

## 2018-09-26 NOTE — ED Provider Notes (Signed)
[facility] Provider Note   CSN: [id number] Arrival date & time: 09/26/18  0510     History   Chief Complaint Chief Complaint  Patient presents with  . Cough  . flu like symptoms    HPI Marene Lenznthony Axley is a 28 y.o. male.  The history is provided by the patient.  Cough  Cough characteristics:  Non-productive Severity:  Moderate Onset quality:  Gradual Duration: several hours ago. Timing:  Intermittent Progression:  Worsening Chronicity:  New Smoker: yes   Relieved by:  Nothing Worsened by:  Nothing Associated symptoms: chills, myalgias and sore throat   Associated symptoms: no headaches    Patient with history of HIV presents with cough.  He reports yesterday he began having sore throat, then this morning he began having cough and worsening sore throat.  He reports feeling feverish at home.  He reports chills and myalgias.  No headache.  He took Aleve prior to arrival Past Medical History:  Diagnosis Date  . HIV (human immunodeficiency virus infection) [facility](HCC)     Patient Active Problem List   Diagnosis Date Noted  . Healthcare maintenance 07/24/2018  . Asymptomatic HIV infection (HCC) 03/03/2018  . History of syphilis 03/03/2018    Past Surgical History:  Procedure Laterality Date  . NO PAST SURGERIES          Home Medications    Prior to Admission medications   Medication Sig Start Date End Date Taking? Authorizing Provider  bictegravir-emtricitabine-tenofovir AF (BIKTARVY) 50-200-25 MG TABS tablet Take 1 tablet by mouth daily. 07/25/18   Blanchard Kelchixon, Stephanie N, NP  fluticasone (FLONASE) 50 MCG/ACT nasal spray Place 2 sprays into both nostrils daily. Patient not taking: Reported on 07/25/2018 02/24/18   Belinda FisherYu, Amy V, PA-C  ibuprofen (ADVIL,MOTRIN) 800 MG tablet Take one tablet every 8 hours as necessary for fever or achiness Patient not taking: Reported on 07/25/2018 11/22/16   Deatra Canterxford, William J, FNP  ipratropium  (ATROVENT) 0.06 % nasal spray Place 2 sprays into both nostrils 4 (four) times daily. Patient not taking: Reported on 07/25/2018 02/24/18   Belinda FisherYu, Amy V, PA-C  Polyethyl Glycol-Propyl Glycol (SYSTANE) 0.4-0.3 % GEL ophthalmic gel Place 1 application into both eyes at bedtime as needed. Patient not taking: Reported on 07/25/2018 02/24/18   Belinda FisherYu, Amy V, PA-C  sulfamethoxazole-trimethoprim (BACTRIM DS,SEPTRA DS) 800-160 MG tablet Take 1 tablet by mouth daily. 07/25/18   Blanchard Kelchixon, Stephanie N, NP    Family History Family History  Problem Relation Age of Onset  . Cancer Mother        bile duct cancer   . Hypertension Father   . Brain cancer Brother     Social History Social History   Tobacco Use  . Smoking status: Current Every Day Smoker    Packs/day: 0.25    Types: Cigarettes  . Smokeless tobacco: Never Used  Substance Use Topics  . Alcohol use: Yes    Comment: occasionally, 1-2 drinks a week   . Drug use: Yes    Types: Marijuana     Allergies   Patient has no known allergies.   Review of Systems Review of Systems  Constitutional: Positive for chills.  HENT: Positive for sore throat.   Respiratory: Positive for cough.   Gastrointestinal: Negative for vomiting.  Musculoskeletal: Positive for myalgias.  Neurological: Negative for headaches.  All other systems reviewed and are negative.    Physical Exam Updated Vital Signs BP 120/83   Pulse 84  Temp 98.3 F (36.8 C) (Oral)   Ht 1.778 m (5\' 10" )   Wt 63.5 kg   SpO2 97%   BMI 20.09 kg/m   Physical Exam CONSTITUTIONAL: Well developed/well nourished HEAD: Normocephalic/atraumatic EYES: EOMI/PERRL ENMT: Mucous membranes moist, uvula midline with mild erythema, no exudates NECK: supple no meningeal signs SPINE/BACK:entire spine nontender CV: S1/S2 noted, no murmurs/rubs/gallops noted LUNGS: Lungs are clear to auscultation bilaterally, no apparent distress ABDOMEN: soft, nontender, no rebound or guarding, bowel sounds  noted throughout abdomen GU:no cva tenderness NEURO: Pt is awake/alert/appropriate, moves all extremitiesx4.  No facial droop.   EXTREMITIES: pulses normal/equal, full ROM SKIN: warm, color normal PSYCH: no abnormalities of mood noted, alert and oriented to situation   ED Treatments / Results  Labs (all labs ordered are listed, but only abnormal results are displayed) Labs Reviewed - No data to display  EKG None  Radiology Dg Chest 2 View  Result Date: 09/26/2018 CLINICAL DATA:  Cough, fever, and weakness. EXAM: CHEST - 2 VIEW COMPARISON:  09/08/2016 FINDINGS: Normal heart size and mediastinal contours. No acute infiltrate or edema. No effusion or pneumothorax. No acute osseous findings. IMPRESSION: Negative chest. Electronically Signed   By: Marnee Spring M.D.   On: 09/26/2018 06:32    Procedures Procedures Medications Ordered in ED Medications  acetaminophen (TYLENOL) tablet 650 mg (650 mg Oral Given 09/26/18 7902)     Initial Impression / Assessment and Plan / ED Course  I have reviewed the triage vital signs and the nursing notes.  Pertinent maging results that were available during my care of the patient were reviewed by me and considered in my medical decision making (see chart for details).     5:59 AM Patient well-appearing.  He presents with cough and sore throat.  Lung sounds are clear. Recently diagnosed with HIV, last CD4 count was 230 However he recently started medications around that time He takes Biktarvy and Bactrim daily. He is in no acute distress. Will obtain chest x-ray, and if negative will discharge home with Tamiflu as influenza is a possibility 6:55 AM Patient stable.  No hypoxia.  X-ray is negative.  Suspect flulike illness.  He is appropriate discharge home.  Discussed the risk and benefits of starting Tamiflu, he would like to start this.  We discussed return precautions Final Clinical Impressions(s) / ED Diagnoses   Final diagnoses:    Flu-like symptoms    ED Discharge Orders         Ordered    oseltamivir (TAMIFLU) 75 MG capsule  Every 12 hours     09/26/18 4097           Zadie Rhine, MD 09/26/18 [phone]

## 2018-09-26 NOTE — ED Notes (Signed)
Patient transported to X-ray 

## 2018-09-26 NOTE — ED Triage Notes (Signed)
Pt woke up w/ cough/congestion, weakness, general body aches.  Denies n/v/d, no headache.

## 2018-09-27 ENCOUNTER — Encounter (HOSPITAL_COMMUNITY): Payer: Self-pay | Admitting: Emergency Medicine

## 2018-09-27 ENCOUNTER — Other Ambulatory Visit: Payer: Self-pay

## 2018-09-27 ENCOUNTER — Emergency Department (HOSPITAL_COMMUNITY)
Admission: EM | Admit: 2018-09-27 | Discharge: 2018-09-27 | Disposition: A | Discharge status: Home / Self Care | Payer: Self-pay | Attending: Emergency Medicine | Admitting: Emergency Medicine

## 2018-09-27 ENCOUNTER — Emergency Department (HOSPITAL_COMMUNITY): Payer: Self-pay

## 2018-09-27 DIAGNOSIS — R6889 Other general symptoms and signs: Secondary | ICD-10-CM

## 2018-09-27 DIAGNOSIS — F17210 Nicotine dependence, cigarettes, uncomplicated: Secondary | ICD-10-CM | POA: Insufficient documentation

## 2018-09-27 DIAGNOSIS — Z79899 Other long term (current) drug therapy: Secondary | ICD-10-CM | POA: Insufficient documentation

## 2018-09-27 DIAGNOSIS — B20 Human immunodeficiency virus [HIV] disease: Secondary | ICD-10-CM | POA: Insufficient documentation

## 2018-09-27 DIAGNOSIS — J111 Influenza due to unidentified influenza virus with other respiratory manifestations: Secondary | ICD-10-CM | POA: Insufficient documentation

## 2018-09-27 LAB — COMPREHENSIVE METABOLIC PANEL
ALT: 33 U/L (ref 0–44)
AST: 42 U/L — ABNORMAL HIGH (ref 15–41)
Albumin: 4.1 g/dL (ref 3.5–5.0)
Alkaline Phosphatase: 83 U/L (ref 38–126)
Anion gap: 10 (ref 5–15)
BUN: 11 mg/dL (ref 6–20)
CALCIUM: 9.2 mg/dL (ref 8.9–10.3)
CO2: 25 mmol/L (ref 22–32)
Chloride: 101 mmol/L (ref 98–111)
Creatinine, Ser: 1.29 mg/dL — ABNORMAL HIGH (ref 0.61–1.24)
GFR calc Af Amer: >60 mL/min (ref >60)
GFR calc non Af Amer: >60 mL/min (ref >60)
Glucose, Bld: 92 mg/dL (ref 70–99)
Potassium: 4.1 mmol/L (ref 3.5–5.1)
Sodium: 136 mmol/L (ref 135–145)
Total Bilirubin: 0.4 mg/dL (ref 0.3–1.2)
Total Protein: 7.6 g/dL (ref 6.5–8.1)

## 2018-09-27 LAB — CBC WITH DIFFERENTIAL/PLATELET
Abs Immature Granulocytes: 0.01 10*3/uL (ref 0.00–0.07)
Basophils Absolute: 0 10*3/uL (ref 0.0–0.1)
Basophils Relative: 1 %
Eosinophils Absolute: 0 10*3/uL (ref 0.0–0.5)
Eosinophils Relative: 1 %
HCT: 45.1 % (ref 39.0–52.0)
Hemoglobin: 15 g/dL (ref 13.0–17.0)
Immature Granulocytes: 0 %
Lymphocytes Relative: 20 %
Lymphs Abs: 0.8 10*3/uL (ref 0.7–4.0)
MCH: 30.7 pg (ref 26.0–34.0)
MCHC: 33.3 g/dL (ref 30.0–36.0)
MCV: 92.2 fL (ref 80.0–100.0)
Monocytes Absolute: 0.5 10*3/uL (ref 0.1–1.0)
Monocytes Relative: 14 %
Neutro Abs: 2.5 10*3/uL (ref 1.7–7.7)
Neutrophils Relative %: 64 %
Platelets: 220 10*3/uL (ref 150–400)
RBC: 4.89 MIL/uL (ref 4.22–5.81)
RDW: 15.1 % (ref 11.5–15.5)
WBC: 3.9 10*3/uL — AB (ref 4.0–10.5)
nRBC: 0 % (ref 0.0–0.2)

## 2018-09-27 LAB — LACTIC ACID, PLASMA
Lactic Acid, Venous: 1.1 mmol/L (ref 0.5–1.9)
Lactic Acid, Venous: 1.1 mmol/L (ref 0.5–1.9)

## 2018-09-27 MED ORDER — LACTATED RINGERS IV BOLUS
1000.0000 mL | Freq: Once | INTRAVENOUS | Status: AC
  Administered 2018-09-27: 1000 mL via INTRAVENOUS

## 2018-09-27 NOTE — Discharge Instructions (Addendum)
Days continue taking your Tamiflu to help with your symptoms.  This may take a couple of days or a week to improve.  Follow-up with your primary care as needed.

## 2018-09-27 NOTE — ED Notes (Signed)
Patient returned from xray.

## 2018-09-27 NOTE — ED Triage Notes (Signed)
Pt. given Tamaflu

## 2018-09-27 NOTE — ED Notes (Signed)
Pt ambulatory to bathroom without any problems 

## 2018-09-27 NOTE — ED Notes (Signed)
Patient transported to X-ray 

## 2018-09-27 NOTE — ED Notes (Signed)
Pt to xray at this time.

## 2018-09-27 NOTE — ED Provider Notes (Signed)
  Physical Exam  BP 126/89 (BP Location: Left Arm)   Pulse (!) 106   Temp 99.8 F (37.7 C) (Oral)   Resp 16   SpO2 99%   Physical Exam  ED Course/Procedures     Procedures  MDM  Care received from Dr. Gwenlyn FudgeGoldstein at shift change, please see his note for a full HPI.  Briefly, patient with a previous history of HIV diagnosed with the flu yesterday currently on Tamiflu reports no improvement in symptoms within 24 hours.  Tachycardic on arrival but reassuring oxygen levels.  Signout pending lab work, x-ray, lactic.  CMP showed an elevation on creatinine at 1.29 since his previous visit but consistent with other visits.  CBC showed white blood cell count at 3.9.  Lactic acid was negative, x-ray showed, patient was given a liter bolus while in the ED.  He is currently requesting discharge.  Recheck patient's vitals along with reassessment and discharge patient.  Is requesting a note for work, patient currently does ballet I have stated that I can provide a note for today and tomorrow but he will need to return to work on Friday.  Return precautions provided for patient.       Claude MangesSoto, Ory Elting, PA-C 09/27/18 2024    Pricilla LovelessGoldston, Scott, MD 09/29/18 [phone]

## 2018-09-27 NOTE — ED Provider Notes (Signed)
[facility]   CSN: [id number] Arrival date & time: 09/27/18  1659     History   Chief Complaint No chief complaint on file.   HPI Duane Oconnell is a 28 y.o. male.  HPI  28 year old male presents with continued flulike symptoms.  Overall started on 1/20.  Was seen here in the early morning of 1/21.  Diagnosed with influenza and placed on Tamiflu.  He states the cough is continuing and he has green and brown sputum.  No shortness of breath but he has a little bit of chest tightness.  He is also having diffuse myalgias which are a little worse and he is been taking Advil.  No headache or photophobia.  No neck stiffness.  He has had a little bit of nausea but no vomiting.  Has had some diarrhea.  He has not noticed fever above 100.  Past Medical History:  Diagnosis Date  . HIV (human immunodeficiency virus infection) [facility](HCC)     Patient Active Problem List   Diagnosis Date Noted  . Healthcare maintenance 07/24/2018  . Asymptomatic HIV infection (HCC) 03/03/2018  . History of syphilis 03/03/2018    Past Surgical History:  Procedure Laterality Date  . NO PAST SURGERIES          Home Medications    Prior to Admission medications   Medication Sig Start Date End Date Taking? Authorizing Provider  bictegravir-emtricitabine-tenofovir AF (BIKTARVY) 50-200-25 MG TABS tablet Take 1 tablet by mouth daily. 07/25/18   Blanchard Kelchixon, Stephanie N, NP  oseltamivir (TAMIFLU) 75 MG capsule Take 1 capsule (75 mg total) by mouth every 12 (twelve) hours. 09/26/18   Zadie RhineWickline, Donald, MD  sulfamethoxazole-trimethoprim (BACTRIM DS,SEPTRA DS) 800-160 MG tablet Take 1 tablet by mouth daily. 07/25/18   Blanchard Kelchixon, Stephanie N, NP    Family History Family History  Problem Relation Age of Onset  . Cancer Mother        bile duct cancer   . Hypertension Father   . Brain cancer Brother     Social History Social History   Tobacco Use  . Smoking status:  Current Every Day Smoker    Packs/day: 0.25    Types: Cigarettes  . Smokeless tobacco: Never Used  Substance Use Topics  . Alcohol use: Yes    Comment: occasionally, 1-2 drinks a week   . Drug use: Yes    Types: Marijuana     Allergies   Patient has no known allergies.   Review of Systems Review of Systems  Constitutional: Negative for fever.  HENT: Positive for sore throat.   Eyes: Negative for photophobia.  Respiratory: Positive for cough and chest tightness. Negative for shortness of breath.   Cardiovascular: Negative for chest pain.  Gastrointestinal: Positive for nausea. Negative for abdominal pain and vomiting.  Musculoskeletal: Positive for myalgias. Negative for neck stiffness.  Neurological: Negative for headaches.  All other systems reviewed and are negative.    Physical Exam Updated Vital Signs BP 126/89 (BP Location: Left Arm)   Pulse (!) 106   Temp 99.8 F (37.7 C) (Oral)   Resp 16   SpO2 99%   Physical Exam Vitals signs and nursing note reviewed.  Constitutional:      General: He is not in acute distress.    Appearance: He is well-developed. He is not ill-appearing, toxic-appearing or diaphoretic.  HENT:     Head: Normocephalic and atraumatic.     Right Ear: External ear normal.  Left Ear: External ear normal.     Nose: Nose normal.     Mouth/Throat:     Pharynx: Posterior oropharyngeal erythema (mild) present. No oropharyngeal exudate.  Eyes:     General:        Right eye: No discharge.        Left eye: No discharge.  Neck:     Musculoskeletal: Normal range of motion and neck supple. No neck rigidity.  Cardiovascular:     Rate and Rhythm: Regular rhythm. Tachycardia present.     Heart sounds: Normal heart sounds.     Comments: HR low 100s Pulmonary:     Effort: Pulmonary effort is normal.     Breath sounds: Normal breath sounds. No stridor. No wheezing, rhonchi or rales.  Abdominal:     General: There is no distension.      Palpations: Abdomen is soft.     Tenderness: There is no abdominal tenderness.  Skin:    General: Skin is warm and dry.  Neurological:     Mental Status: He is alert.  Psychiatric:        Mood and Affect: Mood is not anxious.      ED Treatments / Results  Labs (all labs ordered are listed, but only abnormal results are displayed) Labs Reviewed  CBC WITH DIFFERENTIAL/PLATELET - Abnormal; Notable for the following components:      Result Value   WBC 3.9 (*)    All other components within normal limits  COMPREHENSIVE METABOLIC PANEL  LACTIC ACID, PLASMA  LACTIC ACID, PLASMA    EKG None  Radiology Dg Chest 2 View  Result Date: 09/26/2018 CLINICAL DATA:  Cough, fever, and weakness. EXAM: CHEST - 2 VIEW COMPARISON:  09/08/2016 FINDINGS: Normal heart size and mediastinal contours. No acute infiltrate or edema. No effusion or pneumothorax. No acute osseous findings. IMPRESSION: Negative chest. Electronically Signed   By: Marnee Spring M.D.   On: 09/26/2018 06:32    Procedures Procedures (including critical care time)  Medications Ordered in ED Medications  lactated ringers bolus 1,000 mL (1,000 mLs Intravenous New Bag/Given 09/27/18 1847)     Initial Impression / Assessment and Plan / ED Course  I have reviewed the triage vital signs and the nursing notes.  Pertinent labs & imaging results that were available during my care of the patient were reviewed by me and considered in my medical decision making (see chart for details).     Patient is not ill-appearing.  However given that he is tachycardic when he was not 36 hours ago and he feels a little bit worse from a myalgia standpoint, I think is reasonable to get labs including a lactate and give IV fluids.  With the chest tightness and cough I will get a chest x-ray.  Overall, I still think he has the flu and he should continue Tamiflu.  We discussed that this will probably last several days and up to a week.  He has no  headache or neck stiffness so my suspicion for encephalitis or meningitis is pretty low.  Otherwise, plan is to treat with fluids and check labs and x-ray.  If negative he can be discharged home.  Care transferred to [facility]  Final Clinical Impressions(s) / ED Diagnoses   Final diagnoses:  None    ED Discharge Orders    None       Pricilla Loveless, MD 09/27/18 1902

## 2018-09-27 NOTE — ED Triage Notes (Signed)
Pt. Stated, I was here yesterday and they told me I had the flu, and Im no better. Body aches.

## 2018-10-09 ENCOUNTER — Ambulatory Visit (INDEPENDENT_AMBULATORY_CARE_PROVIDER_SITE_OTHER): Payer: Self-pay | Admitting: Infectious Diseases

## 2018-10-09 ENCOUNTER — Encounter: Payer: Self-pay | Admitting: *Deleted

## 2018-10-09 ENCOUNTER — Encounter: Payer: Self-pay | Admitting: Infectious Diseases

## 2018-10-09 VITALS — BP 135/95 | HR 75 | Temp 98.0°F | Ht 70.0 in | Wt 144.5 lb

## 2018-10-09 DIAGNOSIS — Z21 Asymptomatic human immunodeficiency virus [HIV] infection status: Secondary | ICD-10-CM

## 2018-10-09 DIAGNOSIS — Z23 Encounter for immunization: Secondary | ICD-10-CM

## 2018-10-09 DIAGNOSIS — Z0000 Encounter for general adult medical examination without abnormal findings: Secondary | ICD-10-CM

## 2018-10-09 DIAGNOSIS — Z8619 Personal history of other infectious and parasitic diseases: Secondary | ICD-10-CM

## 2018-10-09 NOTE — Patient Instructions (Addendum)
Always a pleasure to see you.   Please continue your Biktarvy once a day - can take at night if the fogginess/confusion is troublesome for you but I suspect this is either unrelated to your medication or will improve with time.     Will send your results of today's labs on MyChart for you.   Vaccines given today:   Flu (this is a once a flu-season)  Prevnar (pneumonia shot that is once in a lifetime)   The [facility] clinic will be in touch to schedule a visit - their contact is @ [phone]  Please return in 3 months with a lab visit prior to if you can.

## 2018-10-09 NOTE — Progress Notes (Signed)
Name: Duane Oconnell  DOB: 07-Apr-1991 MRN: [id number] PCP: Patient, No Pcp Per    Patient Active Problem List   Diagnosis Date Noted  . Healthcare maintenance 07/24/2018  . Asymptomatic HIV infection (HCC) 03/03/2018  . History of syphilis 03/03/2018     Brief Narrative:  Duane Oconnell is a 28 y.o. male with HIV infection. Originally diagnosed 02-24-18. History of OIs: none. CD4 nadir 180. HIV Risk: MSM.   Previous Regimens: . Biktarvy >>   Genotypes: . 03/2018 - no mutations   Subjective:  CC:  HIV follow up care. No complaints.   HPI/ROS:  Interval history noted for influenza and ER visit about 2 weeks ago. He was treated with Tamiflu and fluids and discharged home. Since that time he has continued to feel better. He feels he got this from his coworker who was also sick. Reports no complaints today suggestive of associated opportunistic infection or advancing HIV disease such as fevers, night sweats, weight loss, anorexia, cough, SOB, nausea, vomiting, diarrhea, headache, sensory changes, lymphadenopathy or oral thrush.    He has done well with his Biktarvy and estimates 100% adherence to the regimen. He has had no unpleasant side effects but does feel that it causes some "foggy-headedness or confusion" after taking it that goes away soon after. He takes his medication every day in the morning before work.   Has a very active job and frequently walks/jobs up to 8 miles a day. Has not yet received the flu vaccine. No trouble with depression and feels better with dx. Not sexually active currently but has been with good condom use. No sx concerning for STI today.    Review of Systems  Constitutional: Negative for chills, fever, malaise/fatigue and weight loss.  HENT: Negative for sore throat.        No dental problems - would like cleaning   Respiratory: Negative for cough and sputum production.   Cardiovascular: Negative for chest pain and leg swelling.  Gastrointestinal:  Negative for abdominal pain, diarrhea and vomiting.  Genitourinary: Negative for dysuria and flank pain.  Musculoskeletal: Negative for joint pain, myalgias and neck pain.  Skin: Negative for rash.  Neurological: Negative for dizziness, tingling and headaches.  Psychiatric/Behavioral: Negative for depression and substance abuse. The patient is not nervous/anxious and does not have insomnia.        Confusion/foggy headed at times    Past Medical History:  Diagnosis Date  . HIV (human immunodeficiency virus infection) (HCC)     Outpatient Medications Prior to Visit  Medication Sig Dispense Refill  . bictegravir-emtricitabine-tenofovir AF (BIKTARVY) 50-200-25 MG TABS tablet Take 1 tablet by mouth daily. 30 tablet 11  . sulfamethoxazole-trimethoprim (BACTRIM DS,SEPTRA DS) 800-160 MG tablet Take 1 tablet by mouth daily. 30 tablet 3  . oseltamivir (TAMIFLU) 75 MG capsule Take 1 capsule (75 mg total) by mouth every 12 (twelve) hours. 10 capsule 0   No facility-administered medications prior to visit.      No Known Allergies  Social History   Tobacco Use  . Smoking status: Former Smoker    Packs/day: 0.25    Types: Cigarettes    Last attempt to quit: 10/06/2018  . Smokeless tobacco: Never Used  Substance Use Topics  . Alcohol use: Yes    Comment: occasionally, 1-2 drinks a week   . Drug use: Yes    Types: Marijuana    Social History   Substance and Sexual Activity  Sexual Activity Not Currently  . Partners: Male  .  Birth control/protection: Condom     Objective:   Vitals:   10/09/18 0956  BP: (!) 135/95  Pulse: 75  Temp: 98 F (36.7 C)  TempSrc: Oral  Weight: 144 lb 8 oz (65.5 kg)  Height: 5\' 10"  (1.778 m)   Body mass index is 20.73 kg/m.  Physical Exam Vitals signs reviewed.  Constitutional:      Appearance: He is well-developed.     Comments: Thin appearing. Non-toxic. Comfortable.   Neck:     Musculoskeletal: Normal range of motion.     Thyroid: No  thyromegaly.  Cardiovascular:     Rate and Rhythm: Normal rate and regular rhythm.     Heart sounds: Normal heart sounds. No murmur.  Pulmonary:     Effort: Pulmonary effort is normal. No respiratory distress.     Breath sounds: Normal breath sounds.  Abdominal:     General: There is no distension.     Tenderness: There is no abdominal tenderness.  Lymphadenopathy:     Cervical: No cervical adenopathy.  Skin:    General: Skin is warm and dry.     Findings: No rash.  Neurological:     Mental Status: He is alert and oriented to person, place, and time.  Psychiatric:        Behavior: Behavior normal.        Thought Content: Thought content normal.        Judgment: Judgment normal.     Lab Results Lab Results  Component Value Date   WBC 3.9 (L) 09/27/2018   HGB 15.0 09/27/2018   HCT 45.1 09/27/2018   MCV 92.2 09/27/2018   PLT 220 09/27/2018    Lab Results  Component Value Date   CREATININE 1.29 (H) 09/27/2018   BUN 11 09/27/2018   NA 136 09/27/2018   K 4.1 09/27/2018   CL 101 09/27/2018   CO2 25 09/27/2018    Lab Results  Component Value Date   ALT 33 09/27/2018   AST 42 (H) 09/27/2018   ALKPHOS 83 09/27/2018   BILITOT 0.4 09/27/2018    Lab Results  Component Value Date   CHOL 186 03/08/2018   HDL 51 03/08/2018   LDLCALC 117 (H) 03/08/2018   TRIG 84 03/08/2018   CHOLHDL 3.6 03/08/2018   HIV 1 RNA Quant (copies/mL)  Date Value  07/25/2018 16,400 (H)  03/08/2018 34,000 (H)   CD4 T Cell Abs (/uL)  Date Value  07/25/2018 230 (L)  03/08/2018 180 (L)     Assessment & Plan:   Problem List Items Addressed This Visit      Unprioritized   Asymptomatic HIV infection (HCC) - Primary    Seems to be doing well with biktarvy and reports excellent adherence. Will repeat VL/CD4 today. Went over his previous labs with him and continued with HIV education. I don't think his confusion has anything to do with Biktarvy as a side effect - I do wonder if he still has  some anxiety surrounding the need to take medications daily. I offered him ability to take at night every day if it is interrupting however he does not report this to be so and will continue dose at breakfast.  He declined STI testing today/condoms.  RTC in 6532m with labs prior to.       Relevant Orders   HIV-1 RNA quant-no reflex-bld   T-helper cell (CD4)- ([facility] only)   HIV-1 RNA quant-no reflex-bld   RPR   History of syphilis  Check RPR at next lab draw in 48m.       Healthcare maintenance    Flu and prevnar today. Pneumovax + Menveo at next office visit.         Rexene Alberts, MSN, NP-C [facility] Pager: [phone] Office: [phone]  10/09/18  10:40 AM

## 2018-10-09 NOTE — Assessment & Plan Note (Signed)
Check RPR at next lab draw in 51m.

## 2018-10-09 NOTE — Assessment & Plan Note (Signed)
Flu and prevnar today. Pneumovax + Menveo at next office visit.

## 2018-10-09 NOTE — Addendum Note (Signed)
Addended by: Lurlean Leyden on: 10/09/2018 05:06 PM   Modules accepted: Orders

## 2018-10-09 NOTE — Assessment & Plan Note (Signed)
Seems to be doing well with biktarvy and reports excellent adherence. Will repeat VL/CD4 today. Went over his previous labs with him and continued with HIV education. I don't think his confusion has anything to do with Biktarvy as a side effect - I do wonder if he still has some anxiety surrounding the need to take medications daily. I offered him ability to take at night every day if it is interrupting however he does not report this to be so and will continue dose at breakfast.  He declined STI testing today/condoms.  RTC in 2558m with labs prior to.

## 2018-10-10 ENCOUNTER — Encounter: Payer: Self-pay | Admitting: Infectious Diseases

## 2018-10-10 LAB — T-HELPER CELL (CD4) - (RCID CLINIC ONLY)
CD4 % Helper T Cell: 17 % — ABNORMAL LOW (ref 33–55)
CD4 T Cell Abs: 240 /uL — ABNORMAL LOW (ref 400–2700)

## 2018-10-11 LAB — HIV-1 RNA QUANT-NO REFLEX-BLD
HIV 1 RNA Quant: <20 copies/mL — AB
HIV-1 RNA Quant, Log: <1.3 Log copies/mL — AB

## 2018-11-14 ENCOUNTER — Emergency Department (HOSPITAL_COMMUNITY)
Admission: EM | Admit: 2018-11-14 | Discharge: 2018-11-14 | Disposition: A | Discharge status: Home / Self Care | Payer: Self-pay | Attending: Emergency Medicine | Admitting: Emergency Medicine

## 2018-11-14 ENCOUNTER — Encounter (HOSPITAL_COMMUNITY): Payer: Self-pay | Admitting: *Deleted

## 2018-11-14 ENCOUNTER — Other Ambulatory Visit: Payer: Self-pay

## 2018-11-14 DIAGNOSIS — Z79899 Other long term (current) drug therapy: Secondary | ICD-10-CM | POA: Insufficient documentation

## 2018-11-14 DIAGNOSIS — Z87891 Personal history of nicotine dependence: Secondary | ICD-10-CM | POA: Insufficient documentation

## 2018-11-14 DIAGNOSIS — R112 Nausea with vomiting, unspecified: Secondary | ICD-10-CM | POA: Insufficient documentation

## 2018-11-14 DIAGNOSIS — R197 Diarrhea, unspecified: Secondary | ICD-10-CM | POA: Insufficient documentation

## 2018-11-14 LAB — COMPREHENSIVE METABOLIC PANEL
ALT: 40 U/L (ref 0–44)
AST: 57 U/L — ABNORMAL HIGH (ref 15–41)
Albumin: 3.6 g/dL (ref 3.5–5.0)
Alkaline Phosphatase: 68 U/L (ref 38–126)
Anion gap: 9 (ref 5–15)
BUN: 13 mg/dL (ref 6–20)
CO2: 27 mmol/L (ref 22–32)
Calcium: 9.3 mg/dL (ref 8.9–10.3)
Chloride: 98 mmol/L (ref 98–111)
Creatinine, Ser: 1.14 mg/dL (ref 0.61–1.24)
GFR calc Af Amer: >60 mL/min (ref >60)
GFR calc non Af Amer: >60 mL/min (ref >60)
GLUCOSE: 99 mg/dL (ref 70–99)
Potassium: 4.3 mmol/L (ref 3.5–5.1)
Sodium: 134 mmol/L — ABNORMAL LOW (ref 135–145)
TOTAL PROTEIN: 7.3 g/dL (ref 6.5–8.1)
Total Bilirubin: 1.3 mg/dL — ABNORMAL HIGH (ref 0.3–1.2)

## 2018-11-14 LAB — CBC WITH DIFFERENTIAL/PLATELET
Abs Immature Granulocytes: 0.02 10*3/uL (ref 0.00–0.07)
BASOS PCT: 1 %
Basophils Absolute: 0 10*3/uL (ref 0.0–0.1)
EOS ABS: 0.1 10*3/uL (ref 0.0–0.5)
Eosinophils Relative: 1 %
HCT: 42.6 % (ref 39.0–52.0)
Hemoglobin: 14.3 g/dL (ref 13.0–17.0)
Immature Granulocytes: 1 %
LYMPHS ABS: 1.4 10*3/uL (ref 0.7–4.0)
Lymphocytes Relative: 33 %
MCH: 31.4 pg (ref 26.0–34.0)
MCHC: 33.6 g/dL (ref 30.0–36.0)
MCV: 93.4 fL (ref 80.0–100.0)
MONOS PCT: 12 %
Monocytes Absolute: 0.5 10*3/uL (ref 0.1–1.0)
Neutro Abs: 2.2 10*3/uL (ref 1.7–7.7)
Neutrophils Relative %: 52 %
Platelets: 240 10*3/uL (ref 150–400)
RBC: 4.56 MIL/uL (ref 4.22–5.81)
RDW: 14 % (ref 11.5–15.5)
WBC: 4.1 10*3/uL (ref 4.0–10.5)
nRBC: 0 % (ref 0.0–0.2)

## 2018-11-14 LAB — LIPASE, BLOOD: LIPASE: 32 U/L (ref 11–51)

## 2018-11-14 MED ORDER — ONDANSETRON 4 MG PO TBDP: 4.0000 mg | ORAL_TABLET | Freq: Three times a day (TID) | ORAL | 0 refills | Status: DC | PRN

## 2018-11-14 MED ORDER — ONDANSETRON HCL 4 MG/2ML IJ SOLN
4.0000 mg | Freq: Once | INTRAMUSCULAR | Status: AC
  Administered 2018-11-14: 4 mg via INTRAVENOUS
  Filled 2018-11-14: qty 2

## 2018-11-14 MED ORDER — DICYCLOMINE HCL 20 MG PO TABS: 20.0000 mg | ORAL_TABLET | Freq: Two times a day (BID) | ORAL | 0 refills | Status: DC

## 2018-11-14 MED ORDER — SODIUM CHLORIDE 0.9 % IV BOLUS
1000.0000 mL | Freq: Once | INTRAVENOUS | Status: AC
  Administered 2018-11-14: 1000 mL via INTRAVENOUS

## 2018-11-14 NOTE — ED Provider Notes (Signed)
[facility] EMERGENCY DEPARTMENT Provider Note   CSN: [id number] Arrival date & time: 11/14/18  1011    History   Chief Complaint Chief Complaint  Patient presents with  . Headache    HPI Zac Torti is a 28 y.o. male.     HPI   Meghan Tiemann is a 28 y.o. male, with a history of HIV, presenting to the ED with nausea, vomiting, and diarrhea for the last four days. No sick contacts.  2-3 episodes of non-bloody nonbilious emesis per day and 3-4 episodes of loose stools daily.  He notes intermittent fever with T-max of 101 F noted last night, for which he took Tylenol.  Last dose of Tylenol was around 7:30 AM this morning. Developed lightheadedness and "foggyheadedness" two days ago. Ate out at a restaurant the day before symptoms.  Poor oral intake with poor hydration. No recent ABX use. Last CD4 count and HIV viral load was checked Oct 09, 2018 and found to be 240 and undetectable, respectively. Denies abdominal pain, chest pain, shortness of breath, headache, neuro deficits, urinary complaints, hematochezia/melena, or any other complaints.     Past Medical History:  Diagnosis Date  . HIV (human immunodeficiency virus infection) [facility])     Patient Active Problem List   Diagnosis Date Noted  . Healthcare maintenance 07/24/2018  . Asymptomatic HIV infection (HCC) 03/03/2018  . History of syphilis 03/03/2018    Past Surgical History:  Procedure Laterality Date  . NO PAST SURGERIES          Home Medications    Prior to Admission medications   Medication Sig Start Date End Date Taking? Authorizing Provider  acetaminophen (TYLENOL) 500 MG tablet Take 500 mg by mouth every 6 (six) hours as needed for fever.   Yes [provider]  bictegravir-emtricitabine-tenofovir AF (BIKTARVY) 50-200-25 MG TABS tablet Take 1 tablet by mouth daily. 07/25/18  Yes Blanchard Kelch, NP  Multiple Vitamin (MULTIVITAMIN WITH MINERALS) TABS tablet Take 1  tablet by mouth daily.   Yes [provider]  dicyclomine (BENTYL) 20 MG tablet Take 1 tablet (20 mg total) by mouth 2 (two) times daily. 11/14/18   Trysten Berti C, PA-C  ondansetron (ZOFRAN ODT) 4 MG disintegrating tablet Take 1 tablet (4 mg total) by mouth every 8 (eight) hours as needed for nausea or vomiting. 11/14/18   Eliane Hammersmith, Hillard Danker, PA-C    Family History Family History  Problem Relation Age of Onset  . Cancer Mother        bile duct cancer   . Hypertension Father   . Brain cancer Brother     Social History Social History   Tobacco Use  . Smoking status: Former Smoker    Packs/day: 0.25    Types: Cigarettes    Last attempt to quit: 10/06/2018    Years since quitting: 0.1  . Smokeless tobacco: Never Used  Substance Use Topics  . Alcohol use: Yes    Comment: occasionally, 1-2 drinks a week   . Drug use: Yes    Types: Marijuana     Allergies   Patient has no known allergies.   Review of Systems Review of Systems  Constitutional: Positive for fever.  HENT: Negative for congestion.   Eyes: Negative for visual disturbance.  Respiratory: Negative for cough and shortness of breath.   Cardiovascular: Negative for chest pain.  Gastrointestinal: Positive for diarrhea, nausea and vomiting. Negative for abdominal pain and blood in stool.  Genitourinary: Negative for  dysuria, frequency and hematuria.  Musculoskeletal: Negative for back pain, neck pain and neck stiffness.  Skin: Negative for rash.  Neurological: Positive for light-headedness. Negative for weakness, numbness and headaches.  All other systems reviewed and are negative.    Physical Exam Updated Vital Signs BP 132/77 (BP Location: Right Arm)   Pulse 81   Temp 98.4 F (36.9 C) (Oral)   Resp 18   SpO2 100%   Physical Exam Vitals signs and nursing note reviewed.  Constitutional:      General: He is not in acute distress.    Appearance: He is well-developed. He is not diaphoretic.  HENT:     Head:  Normocephalic and atraumatic.     Mouth/Throat:     Mouth: Mucous membranes are moist.     Pharynx: Oropharynx is clear.  Eyes:     Extraocular Movements: Extraocular movements intact.     Conjunctiva/sclera: Conjunctivae normal.     Pupils: Pupils are equal, round, and reactive to light.  Neck:     Musculoskeletal: Neck supple.  Cardiovascular:     Rate and Rhythm: Normal rate and regular rhythm.     Pulses: Normal pulses.     Heart sounds: Normal heart sounds.     Comments: Tactile temperature in the extremities appropriate and equal bilaterally. Pulmonary:     Effort: Pulmonary effort is normal. No respiratory distress.     Breath sounds: Normal breath sounds.  Abdominal:     Palpations: Abdomen is soft.     Tenderness: There is no abdominal tenderness. There is no guarding.  Musculoskeletal:     Right lower leg: No edema.     Left lower leg: No edema.  Lymphadenopathy:     Cervical: No cervical adenopathy.  Skin:    General: Skin is warm and dry.  Neurological:     Mental Status: He is alert and oriented to person, place, and time.     Comments: Sensation grossly intact to light touch in the extremities.  Grip strengths equal bilaterally.  Strength 5/5 in all extremities. No gait disturbance. Coordination intact. Cranial nerves III-XII grossly intact. No facial droop.   Psychiatric:        Mood and Affect: Mood and affect normal.        Speech: Speech normal.        Behavior: Behavior normal.      ED Treatments / Results  Labs (all labs ordered are listed, but only abnormal results are displayed) Labs Reviewed  COMPREHENSIVE METABOLIC PANEL - Abnormal; Notable for the following components:      Result Value   Sodium 134 (*)    AST 57 (*)    Total Bilirubin 1.3 (*)    All other components within normal limits  LIPASE, BLOOD  CBC WITH DIFFERENTIAL/PLATELET    EKG None  Radiology No results found.  Procedures Procedures (including critical care  time)  Medications Ordered in ED Medications  sodium chloride 0.9 % bolus 1,000 mL (0 mLs Intravenous Stopped 11/14/18 1459)  ondansetron (ZOFRAN) injection 4 mg (4 mg Intravenous Given 11/14/18 1246)     Initial Impression / Assessment and Plan / ED Course  I have reviewed the triage vital signs and the nursing notes.  Pertinent labs & imaging results that were available during my care of the patient were reviewed by me and considered in my medical decision making (see chart for details).  Clinical Course as of Nov 14 1538  Tue Nov 14, 2018  1326 Patient voices improvement.   [SJ]    Clinical Course User Index [SJ] Jameir Ake C, PA-C       Patient presents with nausea, vomiting, and diarrhea.  Suspicion for viral origin of patient's symptoms. Patient is nontoxic appearing, afebrile, not tachycardic, not tachypneic, not hypotensive, maintains excellent SPO2 on room air, and is in no apparent distress.  Suspect his other symptoms are likely due to mild dehydration.  To this point, he has a mild hyponatremia and mild bilirubin elevation.  These may both be due to dehydration.  However, he has been advised to have his lab work retested in a week or two.  He has no abdominal pain nor does he have abdominal tenderness, so my suspicion for surgical abdomen is low.  He was able to tolerate PO fluid challenge. The patient was given instructions for home care as well as return precautions. Patient voices understanding of these instructions, accepts the plan, and is comfortable with discharge.   Vitals:   11/14/18 1215 11/14/18 1230 11/14/18 1245 11/14/18 1430  BP: 116/85 104/68  106/70  Pulse: 62 62 78 73  Resp:    19  Temp:      TempSrc:      SpO2: 99% 99% 99% 100%     Final Clinical Impressions(s) / ED Diagnoses   Final diagnoses:  Nausea vomiting and diarrhea    ED Discharge Orders         Ordered    ondansetron (ZOFRAN ODT) 4 MG disintegrating tablet  Every 8 hours PRN      11/14/18 1338    dicyclomine (BENTYL) 20 MG tablet  2 times daily     11/14/18 1338           Anselm Pancoast, PA-C 11/14/18 1541    Raeford Razor, MD 11/15/18 1540

## 2018-11-14 NOTE — ED Triage Notes (Signed)
Pt in c/o headache, dizziness,  Bad dreams, bowel incontinence during sleep, c/o thought fogginess, pt immunocompromised, A&O x4, denies n/v/d, A&O x4

## 2018-11-14 NOTE — Discharge Instructions (Addendum)
Nausea, Vomiting, and Diarrhea  Hand washing: Wash your hands throughout the day, but especially before and after touching the face, using the restroom, sneezing, coughing, or touching surfaces that have been coughed or sneezed upon. Hydration: Symptoms will be intensified and complicated by dehydration. Dehydration can also extend the duration of symptoms. Drink plenty of fluids and get plenty of rest. You should be drinking at least half a liter of water an hour to stay hydrated. Electrolyte drinks (ex. Gatorade, Powerade, Pedialyte) are also encouraged. You should be drinking enough fluids to make your urine light yellow, almost clear. If this is not the case, you are not drinking enough water. Please note that some of the treatments indicated below will not be effective if you are not adequately hydrated. Diet: Please concentrate on hydration, however, you may introduce food slowly.  Start with a clear liquid diet, progressed to a full liquid diet, and then bland solids as you are able. Pain or fever: Ibuprofen, Naproxen, or Tylenol for pain or fever.  Nausea/vomiting: Use the Zofran for nausea or vomiting. Diarrhea: May use medications such as loperamide (Imodium) or Bismuth subsalicylate (Pepto-Bismol). Bentyl: This medication is what is known as an antispasmodic and is intended to help reduce abdominal discomfort. Follow-up: Follow-up with a primary care provider on this matter. Return: Return should you develop a fever, bloody diarrhea, increased abdominal pain, uncontrolled vomiting, or any other major concerns.  For prescription assistance, may try using prescription discount sites or apps, such as goodrx.com  A couple of your liver labs were mildly elevated. These should be retested in about a week or two.

## 2018-11-14 NOTE — ED Notes (Signed)
Patient verbalizes understanding of discharge instructions. Opportunity for questioning and answers were provided. Armband removed by staff, pt discharged from ED.  

## 2018-12-26 ENCOUNTER — Other Ambulatory Visit: Payer: Self-pay | Admitting: Behavioral Health

## 2018-12-26 ENCOUNTER — Other Ambulatory Visit: Payer: Self-pay

## 2018-12-26 DIAGNOSIS — Z113 Encounter for screening for infections with a predominantly sexual mode of transmission: Secondary | ICD-10-CM

## 2018-12-26 DIAGNOSIS — Z21 Asymptomatic human immunodeficiency virus [HIV] infection status: Secondary | ICD-10-CM

## 2019-01-09 ENCOUNTER — Encounter: Payer: Self-pay | Admitting: Infectious Diseases

## 2019-01-15 ENCOUNTER — Other Ambulatory Visit: Payer: Self-pay | Admitting: Infectious Diseases

## 2019-01-15 DIAGNOSIS — Z21 Asymptomatic human immunodeficiency virus [HIV] infection status: Secondary | ICD-10-CM

## 2019-08-20 ENCOUNTER — Telehealth: Payer: Self-pay | Admitting: *Deleted

## 2019-08-20 ENCOUNTER — Telehealth: Payer: Self-pay

## 2019-08-20 DIAGNOSIS — Z113 Encounter for screening for infections with a predominantly sexual mode of transmission: Secondary | ICD-10-CM

## 2019-08-20 DIAGNOSIS — Z21 Asymptomatic human immunodeficiency virus [HIV] infection status: Secondary | ICD-10-CM

## 2019-08-20 DIAGNOSIS — Z79899 Other long term (current) drug therapy: Secondary | ICD-10-CM

## 2019-08-20 NOTE — Telephone Encounter (Signed)
RN received refill reqeust for biktarvy.  Patient's adap has expired, he no-showed 3 month follow up in May 2020.  Per patient, he has 1 pill of biktarvy left.  RN stated he needed to come in for financial renewal now and again in January, needed to meet with pharmacy for short term medication supply, have labs drawn and follow up with Colletta [place] when she is back in clinic. Patient verbalized understanding, will come in 12/15. He states he is currently in [place], but will be home tomorrow. Lab orders placed.  Landis Gandy, RN

## 2019-08-20 NOTE — Telephone Encounter (Signed)
COVID-19 Pre-Screening Questions:  Do you currently have a fever (>100 F), chills or unexplained body aches? NO   Are you currently experiencing new cough, shortness of breath, sore throat, runny nose?NO .  Have you recently travelled outside the state of [place] in the last 14 days? NO .  Have you been in contact with someone that is currently pending confirmation of Covid19 testing or has been confirmed to have the Covid19 virus?  NO  **If the patient answers NO to ALL questions -  advise the patient to please call the clinic before coming to the office should any symptoms develop.     

## 2019-08-21 ENCOUNTER — Other Ambulatory Visit: Payer: Self-pay

## 2019-08-21 ENCOUNTER — Other Ambulatory Visit: Payer: Self-pay | Admitting: *Deleted

## 2019-08-21 ENCOUNTER — Encounter: Payer: Self-pay | Admitting: Infectious Diseases

## 2019-08-21 ENCOUNTER — Telehealth: Payer: Self-pay | Admitting: Pharmacy Technician

## 2019-08-21 ENCOUNTER — Ambulatory Visit: Payer: Self-pay

## 2019-08-21 DIAGNOSIS — Z79899 Other long term (current) drug therapy: Secondary | ICD-10-CM

## 2019-08-21 DIAGNOSIS — Z21 Asymptomatic human immunodeficiency virus [HIV] infection status: Secondary | ICD-10-CM

## 2019-08-21 DIAGNOSIS — Z113 Encounter for screening for infections with a predominantly sexual mode of transmission: Secondary | ICD-10-CM

## 2019-08-21 MED ORDER — BIKTARVY 50-200-25 MG PO TABS: 1.0000 | ORAL_TABLET | Freq: Every day | ORAL | 0 refills | Status: DC

## 2019-08-21 NOTE — Telephone Encounter (Signed)
[facility] Patient Advocate Encounter  Completed and sent [employer] Advancing Access application for [place] for this patient who is uninsured.    Patient is approved 08/21/2019 through 09/21/2019.  BIN      [id number] PCN    [id number] GRP    [id number] ID        [id number]  This will bridge him until he is approved for ADAP.   Duane Oconnell. [name] Patient [facility] for Infectious Disease Phone: [phone] Fax:  [phone]

## 2019-08-22 LAB — URINE CYTOLOGY ANCILLARY ONLY
Chlamydia: NEGATIVE
Comment: NEGATIVE
Comment: NORMAL
Neisseria Gonorrhea: NEGATIVE

## 2019-08-22 LAB — T-HELPER CELL (CD4) - (RCID CLINIC ONLY)
CD4 % Helper T Cell: 22 % — ABNORMAL LOW (ref 33–65)
CD4 T Cell Abs: 379 /uL — ABNORMAL LOW (ref 400–1790)

## 2019-08-22 NOTE — Telephone Encounter (Signed)
Thanks Michelle

## 2019-09-04 LAB — COMPLETE METABOLIC PANEL WITH GFR
AG Ratio: 1.6 (calc) (ref 1.0–2.5)
ALT: 26 U/L (ref 9–46)
AST: 21 U/L (ref 10–40)
Albumin: 4.7 g/dL (ref 3.6–5.1)
Alkaline phosphatase (APISO): 85 U/L (ref 36–130)
BUN: 15 mg/dL (ref 7–25)
CO2: 29 mmol/L (ref 20–32)
Calcium: 9.9 mg/dL (ref 8.6–10.3)
Chloride: 103 mmol/L (ref 98–110)
Creat: 1.31 mg/dL (ref 0.60–1.35)
GFR, Est African American: 85 mL/min/{1.73_m2} (ref >=60)
GFR, Est Non African American: 74 mL/min/{1.73_m2} (ref >=60)
Globulin: 3 g/dL (calc) (ref 1.9–3.7)
Glucose, Bld: 97 mg/dL (ref 65–99)
Potassium: 4.4 mmol/L (ref 3.5–5.3)
Sodium: 140 mmol/L (ref 135–146)
Total Bilirubin: 0.4 mg/dL (ref 0.2–1.2)
Total Protein: 7.7 g/dL (ref 6.1–8.1)

## 2019-09-04 LAB — HIV-1 RNA QUANT-NO REFLEX-BLD
HIV 1 RNA Quant: <20 copies/mL — AB
HIV-1 RNA Quant, Log: <1.3 Log copies/mL — AB

## 2019-09-04 LAB — CBC WITH DIFFERENTIAL/PLATELET
Absolute Monocytes: 364 cells/uL (ref 200–950)
Basophils Absolute: 61 cells/uL (ref 0–200)
Basophils Relative: 1.7 %
Eosinophils Absolute: 40 cells/uL (ref 15–500)
Eosinophils Relative: 1.1 %
HCT: 45.6 % (ref 38.5–50.0)
Hemoglobin: 15.3 g/dL (ref 13.2–17.1)
Lymphs Abs: 1829 cells/uL (ref 850–3900)
MCH: 31 pg (ref 27.0–33.0)
MCHC: 33.6 g/dL (ref 32.0–36.0)
MCV: 92.3 fL (ref 80.0–100.0)
MPV: 10.7 fL (ref 7.5–12.5)
Monocytes Relative: 10.1 %
Neutro Abs: 1307 cells/uL — ABNORMAL LOW (ref 1500–7800)
Neutrophils Relative %: 36.3 %
Platelets: 271 10*3/uL (ref 140–400)
RBC: 4.94 10*6/uL (ref 4.20–5.80)
RDW: 12.8 % (ref 11.0–15.0)
Total Lymphocyte: 50.8 %
WBC: 3.6 10*3/uL — ABNORMAL LOW (ref 3.8–10.8)

## 2019-09-04 LAB — LIPID PANEL
Cholesterol: 231 mg/dL — ABNORMAL HIGH (ref <200)
HDL: 65 mg/dL (ref >=40)
LDL Cholesterol (Calc): 143 mg/dL (calc) — ABNORMAL HIGH
Non-HDL Cholesterol (Calc): 166 mg/dL (calc) — ABNORMAL HIGH (ref <130)
Total CHOL/HDL Ratio: 3.6 (calc) (ref <5.0)
Triglycerides: 116 mg/dL (ref <150)

## 2019-09-04 LAB — RPR TITER: RPR Titer: 1:2 {titer} — ABNORMAL HIGH

## 2019-09-04 LAB — RPR: RPR Ser Ql: REACTIVE — AB

## 2019-09-04 LAB — FLUORESCENT TREPONEMAL AB(FTA)-IGG-BLD: Fluorescent Treponemal ABS: REACTIVE — AB

## 2019-09-15 ENCOUNTER — Other Ambulatory Visit: Payer: Self-pay | Admitting: Infectious Diseases

## 2019-09-15 DIAGNOSIS — Z21 Asymptomatic human immunodeficiency virus [HIV] infection status: Secondary | ICD-10-CM

## 2019-09-17 NOTE — Telephone Encounter (Signed)
Upcoming appointment on 09/19/19

## 2019-09-19 ENCOUNTER — Ambulatory Visit (INDEPENDENT_AMBULATORY_CARE_PROVIDER_SITE_OTHER): Payer: Self-pay | Admitting: Infectious Diseases

## 2019-09-19 ENCOUNTER — Other Ambulatory Visit: Payer: Self-pay

## 2019-09-19 ENCOUNTER — Ambulatory Visit: Payer: Self-pay

## 2019-09-19 DIAGNOSIS — Z21 Asymptomatic human immunodeficiency virus [HIV] infection status: Secondary | ICD-10-CM

## 2019-09-19 DIAGNOSIS — Z8619 Personal history of other infectious and parasitic diseases: Secondary | ICD-10-CM

## 2019-09-19 DIAGNOSIS — F4323 Adjustment disorder with mixed anxiety and depressed mood: Secondary | ICD-10-CM

## 2019-09-19 NOTE — Progress Notes (Signed)
Name: Duane Oconnell JFH:[id number]  DOB: 11/01/1990  PCP: Patient, No Pcp Per   Virtual Visit via Telephone Note  I connected with Duane Oconnell on 09/25/19 at  3:30 PM EST by telephone and verified that I am speaking with the correct person using two identifiers.   I discussed the limitations, risks, security and privacy concerns of performing an evaluation and management service by telephone and the availability of in person appointments. I also discussed with the patient that there may be a patient responsible charge related to this service. The patient expressed understanding and agreed to proceed.   Patient Active Problem List   Diagnosis Date Noted  . Adjustment reaction with anxiety and depression 09/25/2019  . Healthcare maintenance 07/24/2018  . Asymptomatic HIV infection (HCC) 03/03/2018  . History of syphilis 03/03/2018     Brief Narrative:  Duane Oconnell is a 29 y.o. male with HIV infection. Originally diagnosed 02-24-18. History of OIs: none. CD4 nadir 180. HIV Risk: MSM.   Previous Regimens: . Biktarvy   Genotypes: . 03/2018 - no mutations   Subjective:  CC:  HIV follow up care. No complaints.    HPI/ROS:  Duane Oconnell is doing well since her last office visit.  Duane Oconnell has done well taking his Biktarvy every day but does recall 1 day when Duane Oconnell missed because Duane Oconnell overslept and missed the alarm on the phone.  Duane Oconnell does feel like Duane Oconnell has had some more vivid dreams since starting the [place].  Duane Oconnell otherwise is tolerating the Biktarvy well and taking it correctly.  No new medications have been added since her last office visit.  Duane Oconnell is reapplied for his UMAP.   Duane Oconnell has been working from home up until recently and presently does not have a job.  Duane Oconnell is currently looking for work now.  Duane Oconnell is eating and drinking well but noticed a little bit of weight gain. Duane Oconnell has no concerns over anxious or depressed mood.  Duane Oconnell does report some interrupted sleep possibly due to vivid dreams or  going to bed late. Duane Oconnell does feel like Duane Oconnell has had more fatigue and not sure what that is from.  His alcohol use has slowed down tremendously.    Review of Systems  Constitutional: Positive for fatigue. Negative for appetite change, chills, fever and unexpected weight change.  Eyes: Negative for visual disturbance.  Respiratory: Negative for cough and shortness of breath.   Cardiovascular: Negative for chest pain and leg swelling.  Gastrointestinal: Negative for abdominal pain, diarrhea and nausea.  Genitourinary: Negative for discharge, dysuria and genital sores.  Musculoskeletal: Negative for joint swelling.  Skin: Negative for color change and rash.  Neurological: Negative for dizziness and headaches.  Hematological: Negative for adenopathy.  Psychiatric/Behavioral: Positive for sleep disturbance. The patient is not nervous/anxious.      Past Medical History:  Diagnosis Date  . HIV (human immunodeficiency virus infection) (HCC)     Outpatient Medications Prior to Visit  Medication Sig Dispense Refill  . acetaminophen (TYLENOL) 500 MG tablet Take 500 mg by mouth every 6 (six) hours as needed for fever.    . dicyclomine (BENTYL) 20 MG tablet Take 1 tablet (20 mg total) by mouth 2 (two) times daily. 20 tablet 0  . Multiple Vitamin (MULTIVITAMIN WITH MINERALS) TABS tablet Take 1 tablet by mouth daily.    . ondansetron (ZOFRAN ODT) 4 MG disintegrating tablet Take 1 tablet (4 mg total) by mouth every 8 (eight) hours as needed for nausea or vomiting. 20  tablet 0  . sulfamethoxazole-trimethoprim (BACTRIM DS) 800-160 MG tablet TAKE 1 TABLET BY MOUTH DAILY 30 tablet 3  . bictegravir-emtricitabine-tenofovir AF (BIKTARVY) 50-200-25 MG TABS tablet Take 1 tablet by mouth daily. 30 tablet 0   No facility-administered medications prior to visit.     No Known Allergies  Social History   Tobacco Use  . Smoking status: Former Smoker    Packs/day: 0.25    Types: Cigarettes    Quit date:  10/06/2018    Years since quitting: 0.9  . Smokeless tobacco: Never Used  Substance Use Topics  . Alcohol use: Yes    Comment: occasionally, 1-2 drinks a week   . Drug use: Yes    Types: Marijuana    Social History   Substance and Sexual Activity  Sexual Activity Not Currently  . Partners: Male  . Birth control/protection: Condom     Objective:   There were no vitals filed for this visit. There is no height or weight on file to calculate BMI.  Physical Exam Pulmonary:     Effort: Pulmonary effort is normal.     Comments: No shortness of breath detected in conversation.  Neurological:     Mental Status: Duane Oconnell is oriented to person, place, and time.  Psychiatric:        Mood and Affect: Mood normal.        Behavior: Behavior normal.        Thought Content: Thought content normal.        Judgment: Judgment normal.     Lab Results Lab Results  Component Value Date   WBC 3.6 (L) 08/21/2019   HGB 15.3 08/21/2019   HCT 45.6 08/21/2019   MCV 92.3 08/21/2019   PLT 271 08/21/2019    Lab Results  Component Value Date   CREATININE 1.31 08/21/2019   BUN 15 08/21/2019   NA 140 08/21/2019   K 4.4 08/21/2019   CL 103 08/21/2019   CO2 29 08/21/2019    Lab Results  Component Value Date   ALT 26 08/21/2019   AST 21 08/21/2019   ALKPHOS 68 11/14/2018   BILITOT 0.4 08/21/2019    Lab Results  Component Value Date   CHOL 231 (H) 08/21/2019   HDL 65 08/21/2019   LDLCALC 143 (H) 08/21/2019   TRIG 116 08/21/2019   CHOLHDL 3.6 08/21/2019   HIV 1 RNA Quant (copies/mL)  Date Value  08/21/2019 <20 DETECTED (A)  10/09/2018 <20 DETECTED (A)  07/25/2018 16,400 (H)   CD4 T Cell Abs (/uL)  Date Value  08/21/2019 379 (L)  10/09/2018 240 (L)  07/25/2018 230 (L)     Assessment & Plan:   Problem List Items Addressed This Visit      Unprioritized   History of syphilis    Syphilis titer remains stable at 1:2.  No concern for reinfection.  We will continue to monitor.        Asymptomatic HIV infection ([employer])    Duane Oconnell continues to do well on his Biktarvy with recent viral load undetectable and CD4 over 300 cells.  We will continue to refill this for him.  UMAP application up-to-date. We will have him return visit for routine care in 4 months with labs prior to.  Okay to do a video visit at that time as well.      Adjustment reaction with anxiety and depression    In further discussion with Duane Oconnell Duane Oconnell is having some trouble with persistent depressed mood and thoughts. Accepted  referral to [facility] for talk therapy. No need for medications at this time - will continue to monitor         Rexene Alberts, MSN, NP-C [facility] for Infectious Disease [facility] Pager: [phone] Office: [phone]  09/25/19  3:58 PM

## 2019-09-25 ENCOUNTER — Encounter: Payer: Self-pay | Admitting: Infectious Diseases

## 2019-09-25 DIAGNOSIS — F4323 Adjustment disorder with mixed anxiety and depressed mood: Secondary | ICD-10-CM | POA: Insufficient documentation

## 2019-09-25 NOTE — Assessment & Plan Note (Signed)
Syphilis titer remains stable at 1:2.  No concern for reinfection.  We will continue to monitor.

## 2019-09-25 NOTE — Assessment & Plan Note (Signed)
In further discussion with Erin he is having some trouble with persistent depressed mood and thoughts. Accepted referral to [facility] for talk therapy. No need for medications at this time - will continue to monitor

## 2019-09-25 NOTE — Assessment & Plan Note (Signed)
Duane Oconnell continues to do well on his Biktarvy with recent viral load undetectable and CD4 over 300 cells.  We will continue to refill this for him.  UMAP application up-to-date. We will have him return visit for routine care in 4 months with labs prior to.  Okay to do a video visit at that time as well.

## 2019-09-27 ENCOUNTER — Ambulatory Visit: Payer: Self-pay

## 2020-01-02 IMAGING — CR DG CHEST 2V
2 series · 2 of 2 positions shown · non-contrast
Comparison: 09/08/2016

CLINICAL DATA: Cough, fever, and weakness.

EXAM:
CHEST - 2 VIEW

[chest pa]
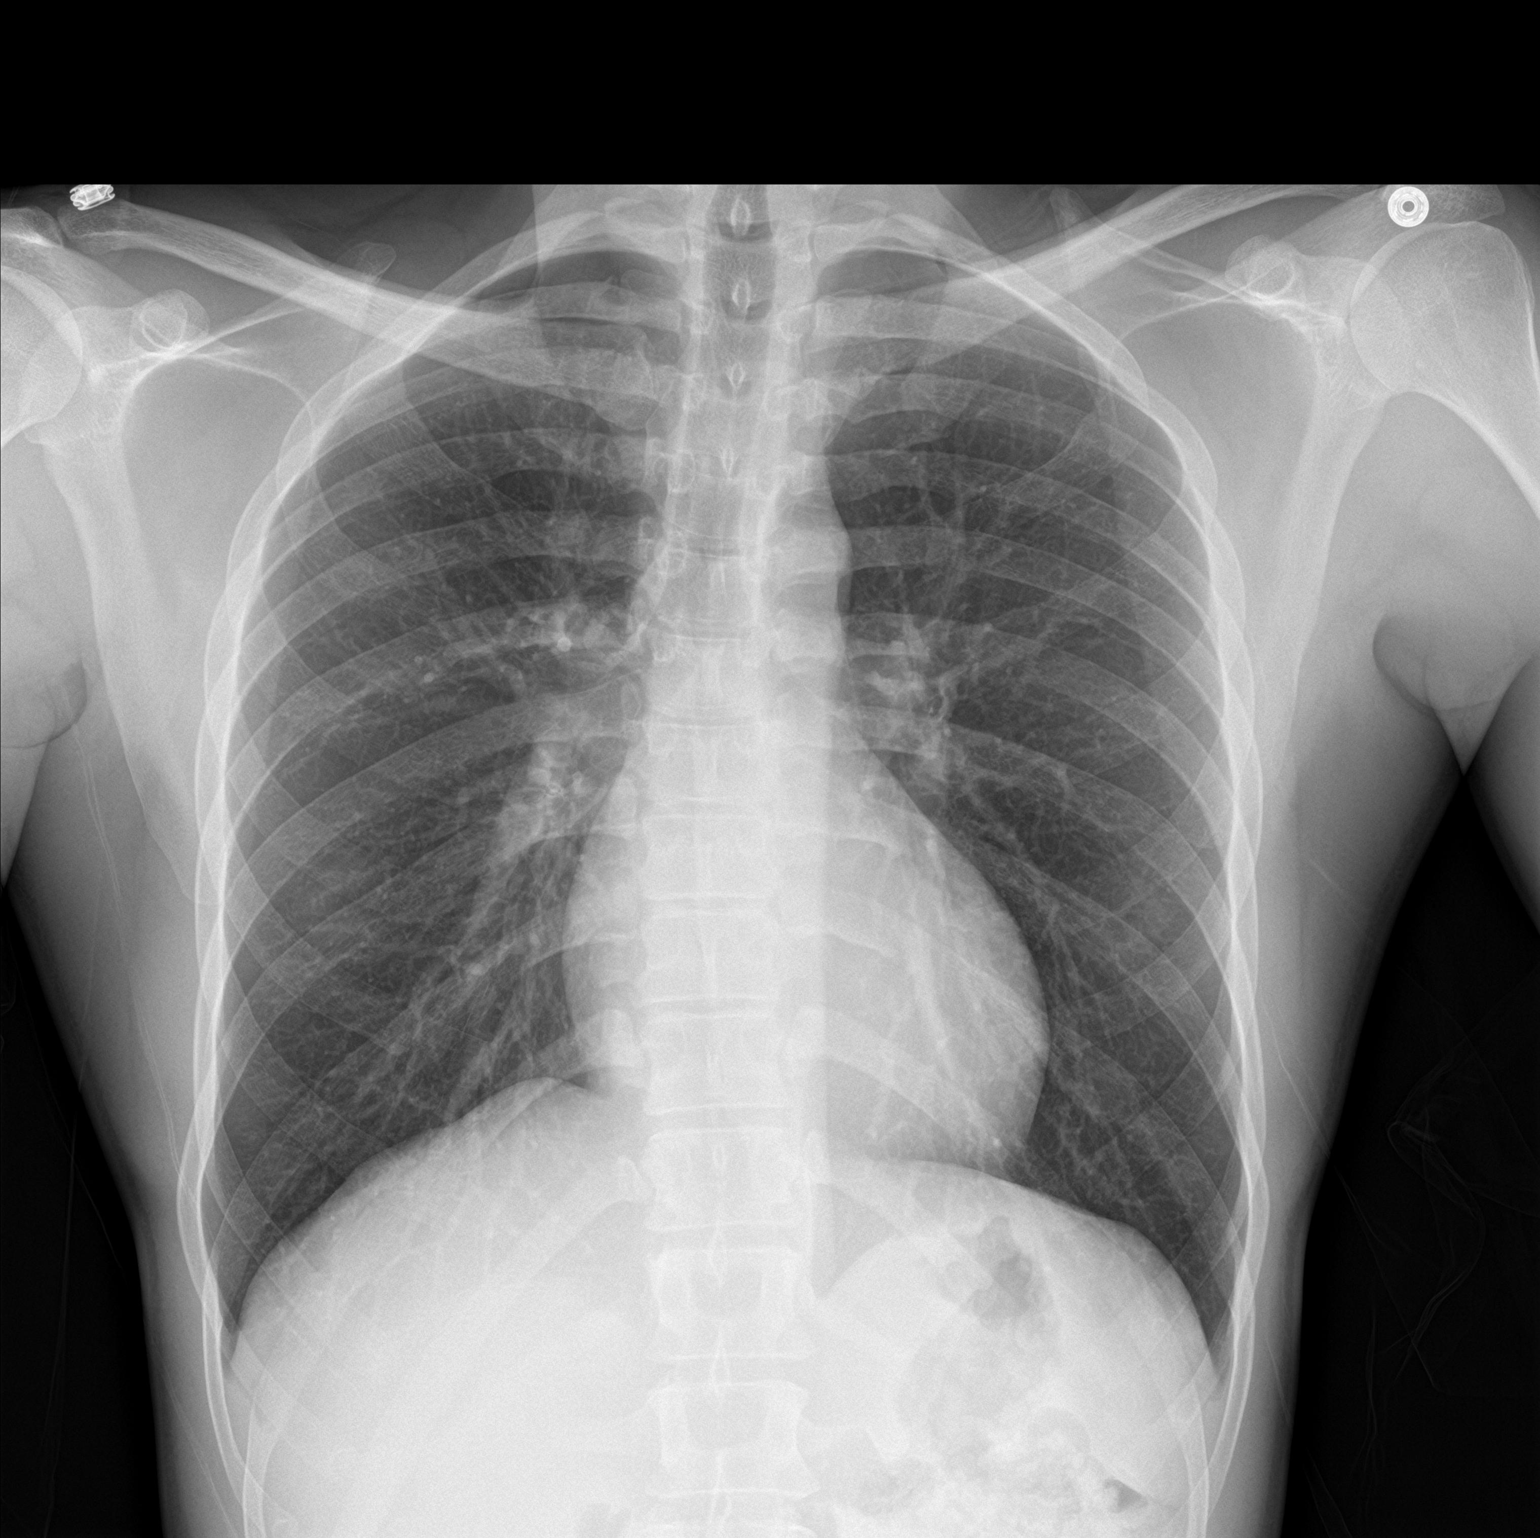

[chest lat]
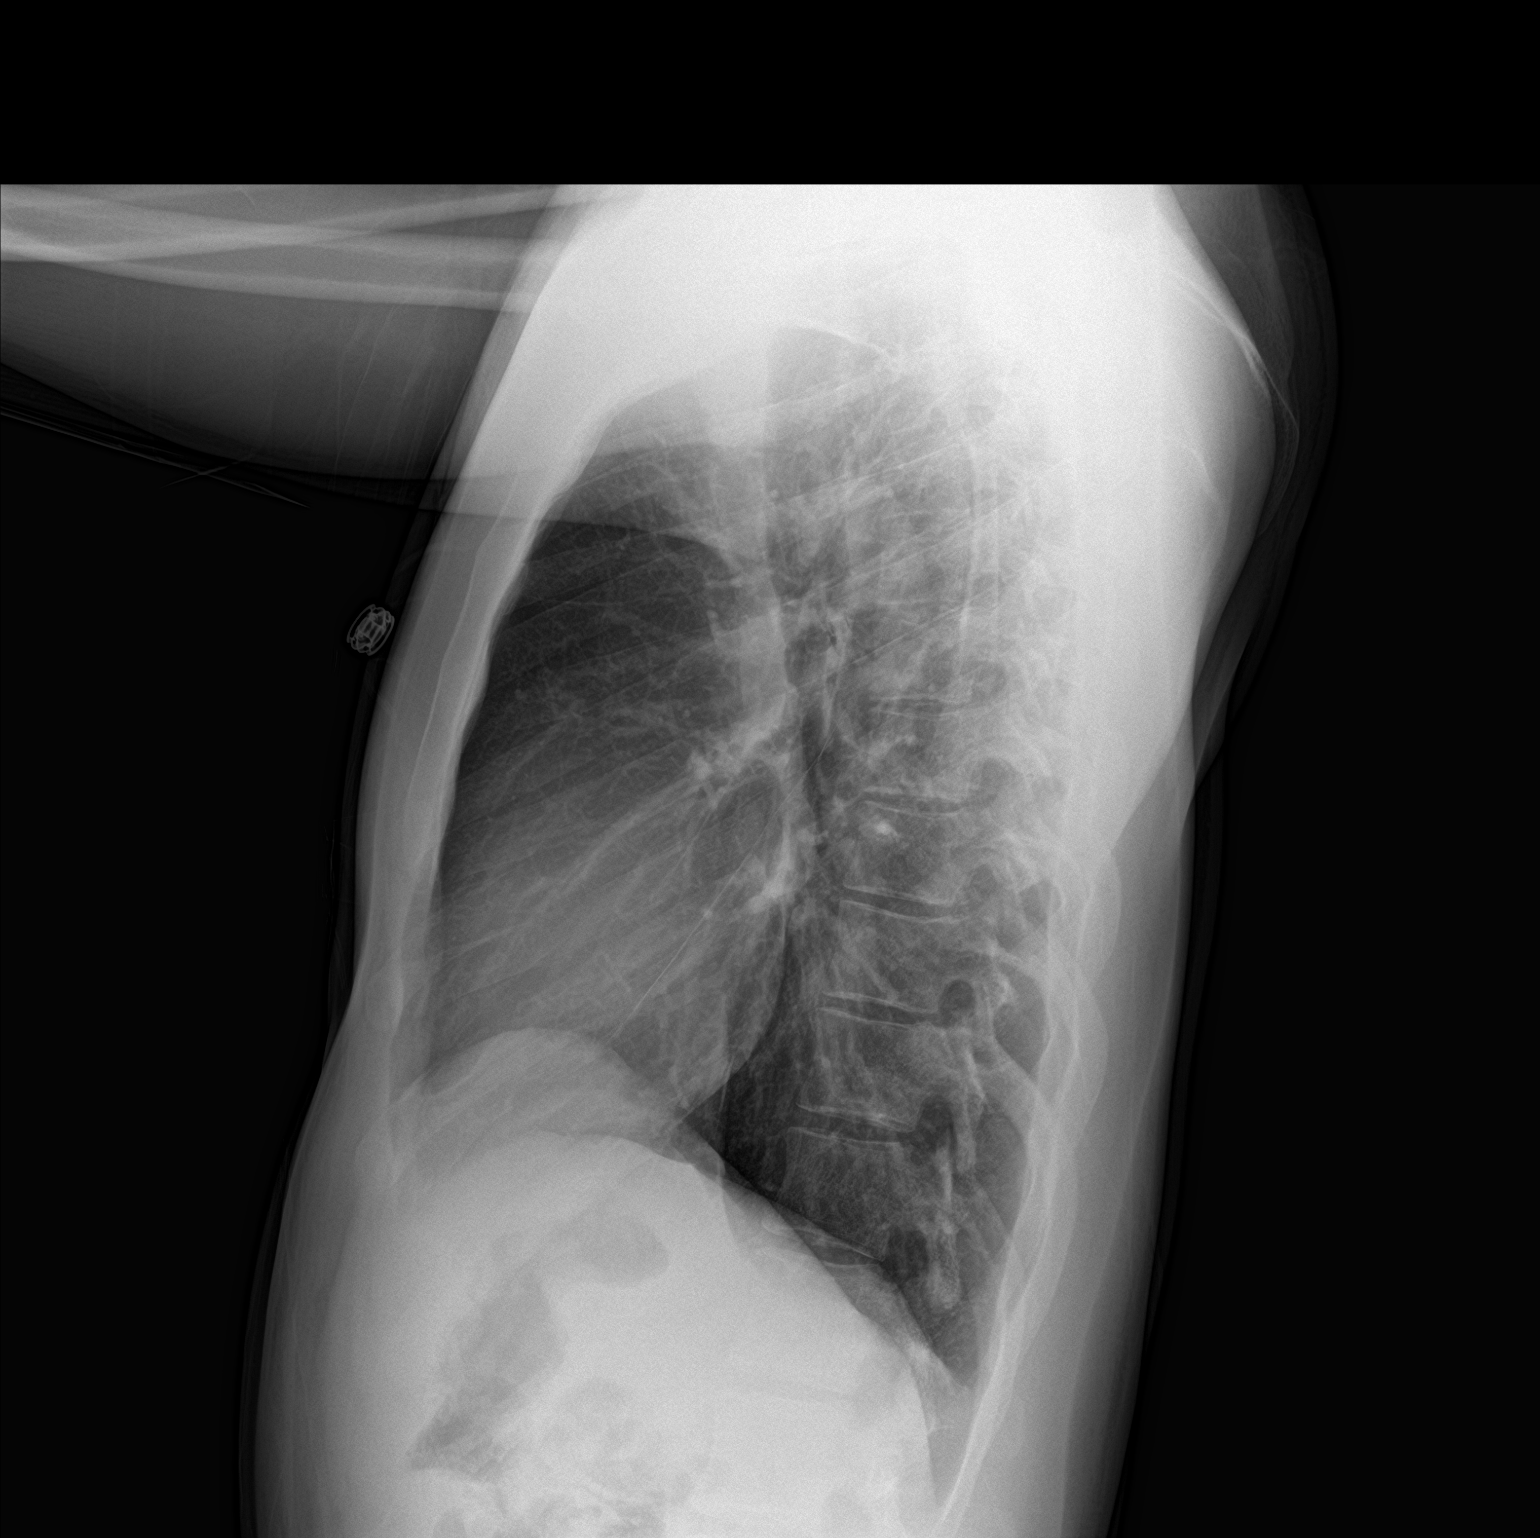

[2 of 2 positions shown; findings below may reference images not displayed]

FINDINGS: Normal heart size and mediastinal contours. No acute infiltrate or
edema. No effusion or pneumothorax. No acute osseous findings.
IMPRESSION: Negative chest.

## 2020-01-17 ENCOUNTER — Other Ambulatory Visit: Payer: Self-pay

## 2020-01-17 DIAGNOSIS — Z21 Asymptomatic human immunodeficiency virus [HIV] infection status: Secondary | ICD-10-CM

## 2020-01-21 ENCOUNTER — Other Ambulatory Visit: Payer: Self-pay

## 2020-02-06 ENCOUNTER — Other Ambulatory Visit: Payer: Self-pay

## 2020-02-06 ENCOUNTER — Telehealth (INDEPENDENT_AMBULATORY_CARE_PROVIDER_SITE_OTHER): Payer: Self-pay | Admitting: Infectious Diseases

## 2020-02-06 ENCOUNTER — Encounter: Payer: Self-pay | Admitting: Infectious Diseases

## 2020-02-06 DIAGNOSIS — Z0000 Encounter for general adult medical examination without abnormal findings: Secondary | ICD-10-CM

## 2020-02-06 DIAGNOSIS — Z21 Asymptomatic human immunodeficiency virus [HIV] infection status: Secondary | ICD-10-CM

## 2020-02-06 DIAGNOSIS — Z8619 Personal history of other infectious and parasitic diseases: Secondary | ICD-10-CM

## 2020-02-06 DIAGNOSIS — F4323 Adjustment disorder with mixed anxiety and depressed mood: Secondary | ICD-10-CM

## 2020-02-06 NOTE — Assessment & Plan Note (Signed)
Duane Oconnell has been doing well with Biktarvy. Has been undetectable since 2020 after a few months of medication with good reconstitution of CD4 to nearly 350 upon last check in December 2020. He is due for lab work and will defer to new provider appointment. Will help bridge with refills until he can be seen and assist in transfer of care.   HIV 1 RNA Quant (copies/mL)  Date Value  08/21/2019 <20 DETECTED (A)  10/09/2018 <20 DETECTED (A)  07/25/2018 16,400 (H)   CD4 T Cell Abs (/uL)  Date Value  08/21/2019 379 (L)  10/09/2018 240 (L)  07/25/2018 230 (L)

## 2020-02-06 NOTE — Assessment & Plan Note (Signed)
He has had improvement in mood and sleep and no needs at this time.

## 2020-02-06 NOTE — Progress Notes (Addendum)
Name: Duane Oconnell WPY:[id number]  DOB: 28-Dec-1990  PCP: Patient, No Pcp Per   VIRTUAL CARE ENCOUNTER  I connected with Duane Oconnell on 02/06/20 at  1:45 PM EDT by MyChart Caregility VIDEO and verified that I am speaking with the correct person using two identifiers.   I discussed the limitations, risks, security and privacy concerns of performing an evaluation and management service by VIDEO and the availability of in person appointments. I also discussed with the patient that there may be a patient responsible charge related to this service. The patient expressed understanding and agreed to proceed.  Patient Location: [place] residence   Other Participants: Duane Boot, RN   Provider Location: [facility]    Patient Active Problem List   Diagnosis Date Noted  . Adjustment reaction with anxiety and depression 09/25/2019  . Healthcare maintenance 07/24/2018  . Asymptomatic HIV infection (HCC) 03/03/2018  . History of syphilis 03/03/2018     Brief Narrative:  Duane Oconnell is a 29 y.o. male with HIV Dx 02-24-18. History of OIs: none.  CD4 nadir 180.  HIV Risk: MSM.   Previous Regimens: . Biktarvy >> suppressed   Genotypes: . 03/2018 - no mutations    Subjective:   Chief Complaint  Patient presents with  . Follow-up    Moved to [facility]; asking for guidance on locating a new ID provider.     HPI/ROS:  Duane Oconnell is doing well. He has relocated to [place] (zip [phone]) and requesting some help transferring care locally. He has no other concerns today. Continues to take his biktarvy everyday without any missed doses, side effects or concerns for access. He knows ADAP re-enrollment is coming up soon.   He has picked up a few pounds lately that he has been attributed to COVID. Has had improvement in sleep and has not had frequent nightmares anymore.      Review of Systems  Constitutional: Negative for appetite change, chills, fatigue, fever and unexpected weight  change.  Eyes: Negative for visual disturbance.  Respiratory: Negative for cough and shortness of breath.   Cardiovascular: Negative for chest pain and leg swelling.  Gastrointestinal: Negative for abdominal pain, diarrhea and nausea.  Genitourinary: Negative for discharge, dysuria and genital sores.  Musculoskeletal: Negative for joint swelling.  Skin: Negative for color change and rash.  Neurological: Negative for dizziness and headaches.  Hematological: Negative for adenopathy.  Psychiatric/Behavioral: Negative for sleep disturbance. The patient is not nervous/anxious.      Past Medical History:  Diagnosis Date  . HIV (human immunodeficiency virus infection) (HCC)     Outpatient Medications Prior to Visit  Medication Sig Dispense Refill  . BIKTARVY 50-200-25 MG TABS tablet TAKE 1 TABLET BY MOUTH DAILY 30 tablet 4  . acetaminophen (TYLENOL) 500 MG tablet Take 500 mg by mouth every 6 (six) hours as needed for fever.    . dicyclomine (BENTYL) 20 MG tablet Take 1 tablet (20 mg total) by mouth 2 (two) times daily. 20 tablet 0  . Multiple Vitamin (MULTIVITAMIN WITH MINERALS) TABS tablet Take 1 tablet by mouth daily.    . ondansetron (ZOFRAN ODT) 4 MG disintegrating tablet Take 1 tablet (4 mg total) by mouth every 8 (eight) hours as needed for nausea or vomiting. 20 tablet 0  . sulfamethoxazole-trimethoprim (BACTRIM DS) 800-160 MG tablet TAKE 1 TABLET BY MOUTH DAILY (Patient not taking: Reported on 02/06/2020) 30 tablet 3   No facility-administered medications prior to visit.     No Known Allergies  Social  History   Tobacco Use  . Smoking status: Current Every Day Smoker    Packs/day: 0.25    Types: Cigarettes    Last attempt to quit: 10/06/2018    Years since quitting: 1.3  . Smokeless tobacco: Never Used  Substance Use Topics  . Alcohol use: Yes    Comment: occasionally, 1-2 drinks a week   . Drug use: Yes    Types: Marijuana    Social History   Substance and Sexual  Activity  Sexual Activity Not Currently  . Partners: Male  . Birth control/protection: Condom     Objective:   There were no vitals filed for this visit. There is no height or weight on file to calculate BMI.  Physical Exam Constitutional:      Appearance: Normal appearance. He is not ill-appearing.  HENT:     Head: Normocephalic.     Mouth/Throat:     Mouth: Mucous membranes are moist.     Pharynx: Oropharynx is clear.  Eyes:     General: No scleral icterus. Pulmonary:     Effort: Pulmonary effort is normal.     Comments: No shortness of breath detected in conversation.  Musculoskeletal:        General: Normal range of motion.     Cervical back: Normal range of motion.  Skin:    Coloration: Skin is not jaundiced or pale.  Neurological:     Mental Status: He is alert and oriented to person, place, and time.  Psychiatric:        Mood and Affect: Mood normal.        Behavior: Behavior normal.        Thought Content: Thought content normal.        Judgment: Judgment normal.     Lab Results Lab Results  Component Value Date   WBC 3.6 (L) 08/21/2019   HGB 15.3 08/21/2019   HCT 45.6 08/21/2019   MCV 92.3 08/21/2019   PLT 271 08/21/2019    Lab Results  Component Value Date   CREATININE 1.31 08/21/2019   BUN 15 08/21/2019   NA 140 08/21/2019   K 4.4 08/21/2019   CL 103 08/21/2019   CO2 29 08/21/2019    Lab Results  Component Value Date   ALT 26 08/21/2019   AST 21 08/21/2019   ALKPHOS 68 11/14/2018   BILITOT 0.4 08/21/2019    Lab Results  Component Value Date   CHOL 231 (H) 08/21/2019   HDL 65 08/21/2019   LDLCALC 143 (H) 08/21/2019   TRIG 116 08/21/2019   CHOLHDL 3.6 08/21/2019   HIV 1 RNA Quant (copies/mL)  Date Value  08/21/2019 <20 DETECTED (A)  10/09/2018 <20 DETECTED (A)  07/25/2018 16,400 (H)   CD4 T Cell Abs (/uL)  Date Value  08/21/2019 379 (L)  10/09/2018 240 (L)  07/25/2018 230 (L)     Assessment & Plan:   Problem List Items  Addressed This Visit      Unprioritized   Asymptomatic HIV infection (HCC) - Primary    Duane Oconnell has been doing well with Biktarvy. Has been undetectable since 2020 after a few months of medication with good reconstitution of CD4 to nearly 350 upon last check in December 2020. He is due for lab work and will defer to new provider appointment. Will help bridge with refills until he can be seen and assist in transfer of care.   HIV 1 RNA Quant (copies/mL)  Date Value  08/21/2019 <20 DETECTED (A)  10/09/2018 <20 DETECTED (A)  07/25/2018 16,400 (H)   CD4 T Cell Abs (/uL)  Date Value  08/21/2019 379 (L)  10/09/2018 240 (L)  07/25/2018 230 (L)         Relevant Orders   Ambulatory referral to Infectious Disease   History of syphilis    Serofast titer 1:2. No concern for re-infection today. Would continue 1-2 times a year screening.       Healthcare maintenance    He is due for pneumovax, meningitis and HPV series.  Also recommended COVID testing.  To follow up with new ID provider in [place].       Adjustment reaction with anxiety and depression    He has had improvement in mood and sleep and no needs at this time.          Follow Up Instructions: Continue Biktarvy Due for pneumovax, menveo and hpv Recommended COVID vaccine Transfer care locally with follow up recommended to complete labs in 1-2 months.     I discussed the assessment and treatment plan with the patient. The patient was provided an opportunity to ask questions and all were answered. The patient agreed with the plan and demonstrated an understanding of the instructions.   The patient was advised to call back or seek an in-person evaluation if the symptoms worsen or if the condition fails to improve as anticipated.   Janene Madeira, MSN, NP-C [facility] [email or web address] Pager: [phone] Office: [place]:  [phone]

## 2020-02-06 NOTE — Assessment & Plan Note (Signed)
He is due for pneumovax, meningitis and HPV series.  Also recommended COVID testing.  To follow up with new ID provider in [place].

## 2020-02-06 NOTE — Patient Instructions (Signed)
   CABENUVA is the new medication to treat HIV. It is an injection in each butt cheek every 30 days at this time.   We would start with a pill "lead in" period for you where you take 2 pills once a day with food for 28 days to make sure you tolerate them well before we do the long acting injection.   Largely the side effects were of course related to the injection itself, but during the studies where this was used to treat HIV patients there were only a very small (< 5) number of patients that stopped it due to pain from the shots. Most of the patients in the trials reported overall improvement in the pain associated with the shots over time.   Patient Information Link: https://gskpro.com/content/dam/global/hcpportal/en_US/Prescribing_Information/Cabenuva/pdf/CABENUVA-PI-PIL-IFU2-IFU3.PDF#page=36   

## 2020-02-06 NOTE — Assessment & Plan Note (Signed)
Serofast titer 1:2. No concern for re-infection today. Would continue 1-2 times a year screening.

## 2020-02-29 ENCOUNTER — Other Ambulatory Visit: Payer: Self-pay | Admitting: Infectious Diseases

## 2020-02-29 DIAGNOSIS — Z21 Asymptomatic human immunodeficiency virus [HIV] infection status: Secondary | ICD-10-CM

## 2020-03-12 ENCOUNTER — Other Ambulatory Visit: Payer: Self-pay

## 2020-03-12 ENCOUNTER — Ambulatory Visit: Payer: Self-pay

## 2020-04-02 ENCOUNTER — Telehealth (INDEPENDENT_AMBULATORY_CARE_PROVIDER_SITE_OTHER): Payer: Self-pay | Admitting: Infectious Diseases

## 2020-04-02 ENCOUNTER — Other Ambulatory Visit: Payer: Self-pay

## 2020-04-02 DIAGNOSIS — Z91199 Patient's noncompliance with other medical treatment and regimen due to unspecified reason: Secondary | ICD-10-CM

## 2020-04-02 DIAGNOSIS — Z5329 Procedure and treatment not carried out because of patient's decision for other reasons: Secondary | ICD-10-CM

## 2020-04-02 NOTE — Progress Notes (Signed)
Patient did not answer at the time of call attempt - no charge.

## 2020-05-23 ENCOUNTER — Telehealth: Payer: Self-pay

## 2020-05-23 DIAGNOSIS — Z21 Asymptomatic human immunodeficiency virus [HIV] infection status: Secondary | ICD-10-CM

## 2020-05-23 MED ORDER — BIKTARVY 50-200-25 MG PO TABS: 1.0000 | ORAL_TABLET | Freq: Every day | ORAL | 0 refills | Status: DC

## 2020-05-23 NOTE — Telephone Encounter (Signed)
Patient requesting medication refill until seen by new ID team in [place]. Patient has appointment scheduled for 10/1. Patient also needs to renew ADAP. Call transferred to [facility] for assist with ADAP renewal. One-time refill sent to pharmacy until patient is seen at new ID office. Patient is aware that he needs to sign a record release form in order for [facility] to send records. Patient verbalized understanding. Call transferred to financial team. Valarie Cones

## 2020-05-26 ENCOUNTER — Encounter: Payer: Self-pay | Admitting: Infectious Diseases

## 2020-06-04 NOTE — Telephone Encounter (Signed)
error 

## 2020-07-26 ENCOUNTER — Other Ambulatory Visit: Payer: Self-pay | Admitting: Infectious Diseases

## 2020-07-26 DIAGNOSIS — Z21 Asymptomatic human immunodeficiency virus [HIV] infection status: Secondary | ICD-10-CM

## 2020-07-28 ENCOUNTER — Telehealth: Payer: Self-pay

## 2020-07-28 NOTE — Telephone Encounter (Signed)
Received a refill request for Biktarvy for the patient. I have spoke to the patient and he is still working on establishing with a new ID clinic. He stated he had tried to establish care with [place] [facility] clinic and that did not work out for him. Patient is now living in [place]. I have advised the patient that I can send in a 30 day supply for him but he will need to establish with a new ID clinic.  Shawnmichael Parenteau T Pricilla Loveless

## 2020-09-18 ENCOUNTER — Ambulatory Visit
Admission: EM | Admit: 2020-09-18 | Discharge: 2020-09-18 | Disposition: A | Discharge status: Home / Self Care | Payer: Self-pay | Attending: Emergency Medicine | Admitting: Emergency Medicine

## 2020-09-18 ENCOUNTER — Other Ambulatory Visit: Payer: Self-pay

## 2020-09-18 DIAGNOSIS — Z1152 Encounter for screening for COVID-19: Secondary | ICD-10-CM

## 2020-09-18 DIAGNOSIS — J069 Acute upper respiratory infection, unspecified: Secondary | ICD-10-CM

## 2020-09-18 MED ORDER — DM-GUAIFENESIN ER 30-600 MG PO TB12
1.0000 | ORAL_TABLET | Freq: Two times a day (BID) | ORAL | 0 refills | Status: DC
Start: 2020-09-18 — End: 2020-10-21

## 2020-09-18 MED ORDER — BENZONATATE 100 MG PO CAPS
100.0000 mg | ORAL_CAPSULE | Freq: Three times a day (TID) | ORAL | 0 refills | Status: DC
Start: 2020-09-18 — End: 2020-10-21

## 2020-09-18 MED ORDER — IBUPROFEN 600 MG PO TABS: 600.0000 mg | ORAL_TABLET | Freq: Four times a day (QID) | ORAL | 0 refills | Status: AC | PRN

## 2020-09-18 NOTE — ED Triage Notes (Signed)
Patient complains of body aches and a cough for 2 days. Pt also states he has had a fever with the highest being 101.9. Pt states he gets winded with ambulation or more tired as well. Pt is aox4 and ambulatory.

## 2020-09-18 NOTE — ED Provider Notes (Signed)
[facility]    CSN: [id number] Arrival date & time: 09/18/20  1732      History   Chief Complaint Chief Complaint  Patient presents with  . Fever    X 2 days 101.9 high  . Generalized Body Aches    X 2 days    HPI Duane Oconnell is a 30 y.o. male presenting today for evaluation of fever and body aches.  Patient reports that symptoms began 2 days ago.  Has had associated cough.  T-max of 101.9.  Reports shortness of breath with exertion.  Taking Biktarvy regularly, viral loads undetectable.  Using Tylenol and TheraFlu without full relief of fevers and symptoms.  Denies any known close sick contacts or COVID exposures.  HPI  Past Medical History:  Diagnosis Date  . HIV (human immunodeficiency virus infection) [facility])     Patient Active Problem List   Diagnosis Date Noted  . Adjustment reaction with anxiety and depression 09/25/2019  . Healthcare maintenance 07/24/2018  . Asymptomatic HIV infection (HCC) 03/03/2018  . History of syphilis 03/03/2018    Past Surgical History:  Procedure Laterality Date  . NO PAST SURGERIES         Home Medications    Prior to Admission medications   Medication Sig Start Date End Date Taking? Authorizing Provider  acetaminophen (TYLENOL) 500 MG tablet Take 500 mg by mouth every 6 (six) hours as needed for fever.   Yes [provider]  benzonatate (TESSALON) 100 MG capsule Take 1 capsule (100 mg total) by mouth every 8 (eight) hours. 09/18/20  Yes Carnella Fryman C, PA-C  BIKTARVY 50-200-25 MG TABS tablet TAKE 1 TABLET BY MOUTH DAILY 07/28/20  Yes Blanchard Kelch, NP  dextromethorphan-guaiFENesin (MUCINEX DM) 30-600 MG 12hr tablet Take 1 tablet by mouth 2 (two) times daily. 09/18/20  Yes Makhari Dovidio C, PA-C  ibuprofen (ADVIL) 600 MG tablet Take 1 tablet (600 mg total) by mouth every 6 (six) hours as needed. 09/18/20  Yes Bueford Arp C, PA-C  Multiple Vitamin (MULTIVITAMIN WITH MINERALS) TABS tablet Take 1  tablet by mouth daily.   Yes [provider]  dicyclomine (BENTYL) 20 MG tablet Take 1 tablet (20 mg total) by mouth 2 (two) times daily. Patient not taking: No sig reported 11/14/18 09/18/20  Anselm Pancoast, PA-C    Family History Family History  Problem Relation Age of Onset  . Cancer Mother        bile duct cancer   . Hypertension Father   . Brain cancer Brother     Social History Social History   Tobacco Use  . Smoking status: Current Every Day Smoker    Packs/day: 0.25    Types: Cigarettes    Last attempt to quit: 10/06/2018    Years since quitting: 1.9  . Smokeless tobacco: Never Used  Vaping Use  . Vaping Use: Never used  Substance Use Topics  . Alcohol use: Yes    Comment: occasionally, 1-2 drinks a week   . Drug use: Yes    Types: Marijuana     Allergies   Patient has no known allergies.   Review of Systems Review of Systems  Constitutional: Positive for fatigue and fever. Negative for activity change, appetite change and chills.  HENT: Positive for congestion, rhinorrhea, sinus pressure and sore throat. Negative for ear pain and trouble swallowing.   Eyes: Negative for discharge and redness.  Respiratory: Positive for cough. Negative for chest tightness and shortness of breath.  Cardiovascular: Negative for chest pain.  Gastrointestinal: Negative for abdominal pain, diarrhea, nausea and vomiting.  Musculoskeletal: Negative for myalgias.  Skin: Negative for rash.  Neurological: Negative for dizziness, light-headedness and headaches.     Physical Exam Triage Vital Signs ED Triage Vitals  Enc Vitals Group     BP 09/18/20 1740 134/89     Pulse Rate 09/18/20 1740 92     Resp 09/18/20 1740 20     Temp 09/18/20 1740 99.4 F (37.4 C)     Temp Source 09/18/20 1740 Oral     SpO2 09/18/20 1740 95 %     Weight --      Height --      Head Circumference --      Peak Flow --      Pain Score 09/18/20 1755 8     Pain Loc --      Pain Edu? --       Excl. in GC? --    No data found.  Updated Vital Signs BP 134/89 (BP Location: Right Arm)   Pulse 92   Temp 99.4 F (37.4 C) (Oral)   Resp 20   SpO2 95%   Visual Acuity Right Eye Distance:   Left Eye Distance:   Bilateral Distance:    Right Eye Near:   Left Eye Near:    Bilateral Near:     Physical Exam Vitals and nursing note reviewed.  Constitutional:      Appearance: He is well-developed and well-nourished.     Comments: No acute distress  HENT:     Head: Normocephalic and atraumatic.     Ears:     Comments: Bilateral ears without tenderness to palpation of external auricle, tragus and mastoid, EAC's without erythema or swelling, TM's with good bony landmarks and cone of light. Non erythematous.     Nose: Nose normal.     Mouth/Throat:     Comments: Oral mucosa pink and moist, no tonsillar enlargement or exudate. Posterior pharynx patent and nonerythematous, no uvula deviation or swelling. Normal phonation. Eyes:     Conjunctiva/sclera: Conjunctivae normal.  Cardiovascular:     Rate and Rhythm: Normal rate and regular rhythm.  Pulmonary:     Effort: Pulmonary effort is normal. No respiratory distress.     Comments: Breathing comfortably at rest, CTABL, no wheezing, rales or other adventitious sounds auscultated Abdominal:     General: There is no distension.  Musculoskeletal:        General: Normal range of motion.     Cervical back: Neck supple.  Skin:    General: Skin is warm and dry.  Neurological:     Mental Status: He is alert and oriented to person, place, and time.  Psychiatric:        Mood and Affect: Mood and affect normal.      UC Treatments / Results  Labs (all labs ordered are listed, but only abnormal results are displayed) Labs Reviewed  NOVEL CORONAVIRUS, NAA  COVID-19, FLU A+B NAA    EKG   Radiology No results found.  Procedures Procedures (including critical care time)  Medications Ordered in UC Medications - No data to  display  Initial Impression / Assessment and Plan / UC Course  I have reviewed the triage vital signs and the nursing notes.  Pertinent labs & imaging results that were available during my care of the patient were reviewed by me and considered in my medical decision making (see chart for details).  Viral URI with cough-symptoms x2 days, COVID/flu test pending, recommending continued symptomatic and supportive care, exam reassuring today.  Rest and fluids.  Discussed strict return precautions. Patient verbalized understanding and is agreeable with plan.  Final Clinical Impressions(s) / UC Diagnoses   Final diagnoses:  Encounter for screening for COVID-19  Viral URI with cough     Discharge Instructions     COVID/flu test pending, monitor MyChart for results Continue Tylenol every 4-6 hours for fevers body aches, chills, may supplement with ibuprofen if needed Tessalon for cough Mucinex DM to further help with cough and congestion As alternative may use other over-the-counter medicines for cough and congestion if preferred Rest and fluids Follow-up with unimproving or worsening    ED Prescriptions    Medication Sig Dispense Auth. Provider   ibuprofen (ADVIL) 600 MG tablet Take 1 tablet (600 mg total) by mouth every 6 (six) hours as needed. 30 tablet Eilam Shrewsbury C, PA-C   benzonatate (TESSALON) 100 MG capsule Take 1 capsule (100 mg total) by mouth every 8 (eight) hours. 21 capsule Kennett Symes C, PA-C   dextromethorphan-guaiFENesin (MUCINEX DM) 30-600 MG 12hr tablet Take 1 tablet by mouth 2 (two) times daily. 20 tablet Ossie Yebra, [place] C, PA-C     PDMP not reviewed this encounter.   Lew Dawes, [place] 09/18/20 1825

## 2020-09-18 NOTE — Discharge Instructions (Signed)
COVID/flu test pending, monitor MyChart for results Continue Tylenol every 4-6 hours for fevers body aches, chills, may supplement with ibuprofen if needed Tessalon for cough Mucinex DM to further help with cough and congestion As alternative may use other over-the-counter medicines for cough and congestion if preferred Rest and fluids Follow-up with unimproving or worsening

## 2020-09-20 LAB — COVID-19, FLU A+B NAA
Influenza A, NAA: NOT DETECTED
Influenza B, NAA: NOT DETECTED
SARS-CoV-2, NAA: DETECTED — AB

## 2020-10-05 ENCOUNTER — Other Ambulatory Visit: Payer: Self-pay | Admitting: Infectious Diseases

## 2020-10-05 DIAGNOSIS — Z21 Asymptomatic human immunodeficiency virus [HIV] infection status: Secondary | ICD-10-CM

## 2020-10-06 ENCOUNTER — Telehealth: Payer: Self-pay

## 2020-10-06 MED ORDER — BIKTARVY 50-200-25 MG PO TABS: 1.0000 | ORAL_TABLET | Freq: Every day | ORAL | 0 refills | Status: DC

## 2020-10-06 NOTE — Addendum Note (Signed)
Addended by: Valarie Cones on: 10/06/2020 11:12 AM   Modules accepted: Orders

## 2020-10-06 NOTE — Telephone Encounter (Signed)
Connected with patient via phone due to refill request of Biktarvy. Patient now living in [place] (did not want to update address in epic) Patient due for financial/office/lab visit. Patient accepted all appointments. Patient states he has not missed any doses of medication. Patient currently has enough medication through next appointment. Patient will get additional refills during next visit.  Valarie Cones

## 2020-10-07 ENCOUNTER — Other Ambulatory Visit: Payer: Self-pay

## 2020-10-07 ENCOUNTER — Ambulatory Visit: Payer: Self-pay

## 2020-10-20 ENCOUNTER — Ambulatory Visit (INDEPENDENT_AMBULATORY_CARE_PROVIDER_SITE_OTHER): Payer: Self-pay

## 2020-10-20 ENCOUNTER — Other Ambulatory Visit: Payer: Self-pay | Admitting: Infectious Diseases

## 2020-10-20 ENCOUNTER — Other Ambulatory Visit: Payer: Self-pay

## 2020-10-20 ENCOUNTER — Telehealth (INDEPENDENT_AMBULATORY_CARE_PROVIDER_SITE_OTHER): Payer: Self-pay | Admitting: Infectious Diseases

## 2020-10-20 ENCOUNTER — Ambulatory Visit: Payer: Self-pay

## 2020-10-20 DIAGNOSIS — Z113 Encounter for screening for infections with a predominantly sexual mode of transmission: Secondary | ICD-10-CM

## 2020-10-20 DIAGNOSIS — Z21 Asymptomatic human immunodeficiency virus [HIV] infection status: Secondary | ICD-10-CM

## 2020-10-20 DIAGNOSIS — Z23 Encounter for immunization: Secondary | ICD-10-CM

## 2020-10-20 DIAGNOSIS — Z5329 Procedure and treatment not carried out because of patient's decision for other reasons: Secondary | ICD-10-CM

## 2020-10-20 NOTE — Progress Notes (Signed)
   Covid-19 Vaccination Clinic  Name:  Duane Oconnell    MRN: [id number] DOB: [birth year]  10/20/2020  Mr. Duane Oconnell was observed post Covid-19 immunization for 15 minutes without incident. He was provided with Vaccine Information Sheet and instruction to access the V-Safe system.   Mr. Duane Oconnell was instructed to call 911 with any severe reactions post vaccine: Marland Kitchen Difficulty breathing  . Swelling of face and throat  . A fast heartbeat  . A bad rash all over body  . Dizziness and weakness

## 2020-10-21 ENCOUNTER — Telehealth (INDEPENDENT_AMBULATORY_CARE_PROVIDER_SITE_OTHER): Payer: Self-pay | Admitting: Infectious Diseases

## 2020-10-21 ENCOUNTER — Other Ambulatory Visit: Payer: Self-pay

## 2020-10-21 ENCOUNTER — Telehealth: Payer: Self-pay | Admitting: Infectious Diseases

## 2020-10-21 ENCOUNTER — Encounter: Payer: Self-pay | Admitting: Infectious Diseases

## 2020-10-21 DIAGNOSIS — R5383 Other fatigue: Secondary | ICD-10-CM

## 2020-10-21 DIAGNOSIS — A530 Latent syphilis, unspecified as early or late: Secondary | ICD-10-CM

## 2020-10-21 DIAGNOSIS — Z21 Asymptomatic human immunodeficiency virus [HIV] infection status: Secondary | ICD-10-CM

## 2020-10-21 DIAGNOSIS — Z8619 Personal history of other infectious and parasitic diseases: Secondary | ICD-10-CM

## 2020-10-21 DIAGNOSIS — Z8616 Personal history of COVID-19: Secondary | ICD-10-CM

## 2020-10-21 LAB — T-HELPER CELL (CD4) - (RCID CLINIC ONLY)
CD4 % Helper T Cell: 31 % — ABNORMAL LOW (ref 33–65)
CD4 T Cell Abs: 568 /uL (ref 400–1790)

## 2020-10-21 MED ORDER — DOXYCYCLINE HYCLATE 100 MG PO TABS
100.0000 mg | ORAL_TABLET | Freq: Two times a day (BID) | ORAL | 0 refills | Status: AC
Start: 2020-10-21 — End: 2020-11-18

## 2020-10-21 NOTE — Patient Instructions (Addendum)
It was very nice to talk to you on the phone today.  I am glad to hear that you are staying well and keeping healthy.    Your medication is working perfectly for you and you are doing a great job taking it as prescribed  Please continue your BIKTARVY once daily as we discussed. Your CD4 count (immune system) looks much stronger than it was 2 years ago. This tells me that you have been doing a great job about getting your medication in everyday.   Vaccines Recommended:   COVID #2 due 11/10/2020 (call for an appointment if you want [place] to give you your second dose.   2nd pneumonia vaccine (pneumovax)   Recommended Next Office Visit:   May 31st   Get the generic brand for Benadryl (diphenhydramine) - take 25 mg (one tablet) every night for the next 1-2 weeks to see if that helps with your sleep and fatigue.   Please continue to be well, stay away from folks that are sick, wash her hands frequently, do not touch her face or mouth and continue to take your medications.   We will need to give you 4 weeks of an antibiotic called doxycycline.  This will treat syphilis infection.  Take with food to help avoid nausea.  Drink with a full glass of water and sit upright for 30 minutes as it can irritate your swallow tube (esophagus).  Do not take with dairy or milk products.  Stay out of the sun as it can make you more susceptible to burn.

## 2020-10-21 NOTE — Progress Notes (Addendum)
Name: Duane Oconnell CHY:[id number]  DOB: 1991/07/19  PCP: Patient, No Pcp Per   VIRTUAL CARE ENCOUNTER  I connected with Duane Oconnell on 10/21/20 at  3:00 PM EST by TELEPHONE and verified that I am speaking with the correct person using two identifiers.   I discussed the limitations, risks, security and privacy concerns of performing an evaluation and management service by virtual service and the availability of in person appointments. I also discussed with the patient that there may be a patient responsible charge related to this service. The patient expressed understanding and agreed to proceed.  Patient Location: [place] residence   Other Participants: Duane Boot, RN   Provider Location: [facility] Office    Patient Active Problem List   Diagnosis Date Noted  . Fatigue 10/21/2020  . History of COVID-19 10/21/2020  . Adjustment reaction with anxiety and depression 09/25/2019  . Healthcare maintenance 07/24/2018  . Asymptomatic HIV infection (HCC) 03/03/2018  . 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 male with HIV Dx 02-24-18. History of OIs: none.  CD4 nadir 180.  HIV Risk: MSM.   Previous Regimens: . Biktarvy >> suppressed   Genotypes: . 03/2018 - no mutations    Subjective:   Chief Complaint  Patient presents with  . Follow-up    HIV routine care     HPI/ROS:  Ever since he had COVID he gets only about 5 hours of sleep a night and is up and down. Feels that he has fatigue during the day.  This is been challenging for him.  He feels like his weight has fluctuated recently. He is 160 lb. He does drink alcohol, mostly infrequently.   Aside from that he has been doing well and has no concerns or side effects to his Biktarvy.  He has been taking this once daily without any misses.   Elevated syphilis titer with recent lab draw.  He says he was was tested in [place] 2 months but actually never heard back about results. No  new partners over the last 12 months.  No symptoms from what he can determine including ulcers, headaches, rash, lymphadenopathy.  He has not received any treatment since July 2020 when we last checked.     Review of Systems  Constitutional: Positive for fatigue. Negative for chills and fever.  HENT: Negative for sore throat.   Eyes: Negative for visual disturbance.  Gastrointestinal: Negative for abdominal pain, anal bleeding and rectal pain.  Genitourinary: Negative for dysuria, genital sores, penile discharge, penile pain, scrotal swelling and testicular pain.  Musculoskeletal: Negative for arthralgias and joint swelling.  Skin: Negative for rash.  Neurological: Negative for headaches.  Hematological: Negative for adenopathy.     Past Medical History:  Diagnosis Date  . HIV (human immunodeficiency virus infection) (HCC)     Outpatient Medications Prior to Visit  Medication Sig Dispense Refill  . acetaminophen (TYLENOL) 500 MG tablet Take 500 mg by mouth every 6 (six) hours as needed for fever.    . bictegravir-emtricitabine-tenofovir AF (BIKTARVY) 50-200-25 MG TABS tablet Take 1 tablet by mouth daily. 30 tablet 0  . Multiple Vitamin (MULTIVITAMIN WITH MINERALS) TABS tablet Take 1 tablet by mouth daily.    Marland Kitchen ibuprofen (ADVIL) 600 MG tablet Take 1 tablet (600 mg total) by mouth every 6 (six) hours as needed. (Patient not taking: Reported on 10/21/2020) 30 tablet 0  . benzonatate (TESSALON) 100 MG capsule Take 1 capsule (100 mg  total) by mouth every 8 (eight) hours. (Patient not taking: Reported on 10/21/2020) 21 capsule 0  . dextromethorphan-guaiFENesin (MUCINEX DM) 30-600 MG 12hr tablet Take 1 tablet by mouth 2 (two) times daily. (Patient not taking: Reported on 10/21/2020) 20 tablet 0   No facility-administered medications prior to visit.     No Known Allergies  Social History   Tobacco Use  . Smoking status: Current Every Day Smoker    Packs/day: 0.25    Types: Cigarettes     Last attempt to quit: 10/06/2018    Years since quitting: 2.0  . Smokeless tobacco: Never Used  Vaping Use  . Vaping Use: Never used  Substance Use Topics  . Alcohol use: Yes    Comment: occasionally, 1-2 drinks a week   . Drug use: Yes    Types: Marijuana    Social History   Substance and Sexual Activity  Sexual Activity Not Currently  . Partners: Male  . Birth control/protection: Condom     Objective:   There were no vitals filed for this visit. There is no height or weight on file to calculate BMI.  Physical Exam Pulmonary:     Effort: Pulmonary effort is normal.     Comments: No shortness of breath detected in conversation.  Neurological:     Mental Status: He is oriented to person, place, and time.  Psychiatric:        Mood and Affect: Mood normal.        Behavior: Behavior normal.        Thought Content: Thought content normal.        Judgment: Judgment normal.     Lab Results Lab Results  Component Value Date   WBC 3.6 (L) 08/21/2019   HGB 15.3 08/21/2019   HCT 45.6 08/21/2019   MCV 92.3 08/21/2019   PLT 271 08/21/2019    Lab Results  Component Value Date   CREATININE 1.23 10/20/2020   BUN 13 10/20/2020   NA 139 10/20/2020   K 4.4 10/20/2020   CL 102 10/20/2020   CO2 33 (H) 10/20/2020    Lab Results  Component Value Date   ALT 38 10/20/2020   AST 57 (H) 10/20/2020   ALKPHOS 68 11/14/2018   BILITOT 0.7 10/20/2020    Lab Results  Component Value Date   CHOL 231 (H) 08/21/2019   HDL 65 08/21/2019   LDLCALC 143 (H) 08/21/2019   TRIG 116 08/21/2019   CHOLHDL 3.6 08/21/2019   HIV 1 RNA Quant (copies/mL)  Date Value  08/21/2019 <20 DETECTED (A)  10/09/2018 <20 DETECTED (A)  07/25/2018 16,400 (H)   CD4 T Cell Abs (/uL)  Date Value  10/20/2020 568  08/21/2019 379 (L)  10/09/2018 240 (L)     Assessment & Plan:   Problem List Items Addressed This Visit      Unprioritized   Late latent syphilis - Primary    He has not had any  partners in the last 12 months.  No active symptoms.  We will presume this is late latent infection given last titer was in July 2020.  We discussed treatment options today and given the fact that he is back and forth between [place] in [place] we decided to do doxycycline twice daily for 4-week course.  Advised to take with food, avoid dairy products.  We will follow-up on blood work in 3 months.      Relevant Medications   doxycycline (VIBRA-TABS) 100 MG tablet   History of  COVID-19    Well recovered.  He does have some lingering fatigue.  He received his first Pfizer vaccine yesterday we will get his second dose the first week of March.      Fatigue    Lingering effect from COVID infection. I will start a once daily antihistamine taken an hour before bed to see if this helps over time.      Asymptomatic HIV infection (HCC)    Travin seems to be doing very well on Biktarvy once daily.  His immune system has recovered nicely over the last few years.  His viral load is not back yet but I suspect he is undetectable over the low viral load.  Vaccinations are being updated with flu and #1 Covid shot recently.  He is due for Pneumovax at his next visit.  Second Covid shot first week of March. Follow-up in 3 to 4 months in person.  We can do labs same day to follow-up for syphilis treatment.  I am not sure that we were able to collect a urine for him.  No symptoms for any other STI concern.  Will have a routine urine screening next office visit.        Follow Up Instructions: As above    I discussed the assessment and treatment plan with the patient. The patient was provided an opportunity to ask questions and all were answered. The patient agreed with the plan and demonstrated an understanding of the instructions.   The patient was advised to call back or seek an in-person evaluation if the symptoms worsen or if the condition fails to improve as anticipated.  I provided 14 minutes of  non-face-to-face time during this encounter.   Rexene Alberts, MSN, NP-C [facility]  [email or web address] Pager: [phone] Office: [phone] [facility] Main Line: [phone]

## 2020-10-21 NOTE — Assessment & Plan Note (Addendum)
Duane Oconnell seems to be doing very well on Biktarvy once daily.  His immune system has recovered nicely over the last few years.  His viral load is not back yet but I suspect he is undetectable over the low viral load.  Vaccinations are being updated with flu and #1 Covid shot recently.  He is due for Pneumovax at his next visit.  Second Covid shot first week of March. Follow-up in 3 to 4 months in person.  We can do labs same day to follow-up for syphilis treatment.  I am not sure that we were able to collect a urine for him.  No symptoms for any other STI concern.  Will have a routine urine screening next office visit.

## 2020-10-21 NOTE — Assessment & Plan Note (Signed)
Lingering effect from COVID infection. I will start a once daily antihistamine taken an hour before bed to see if this helps over time.

## 2020-10-21 NOTE — Assessment & Plan Note (Signed)
Well recovered.  He does have some lingering fatigue.  He received his first Pfizer vaccine yesterday we will get his second dose the first week of March.

## 2020-10-21 NOTE — Assessment & Plan Note (Signed)
He has not had any partners in the last 12 months.  No active symptoms.  We will presume this is late latent infection given last titer was in July 2020.  We discussed treatment options today and given the fact that he is back and forth between [place] in [place] we decided to do doxycycline twice daily for 4-week course.  Advised to take with food, avoid dairy products.  We will follow-up on blood work in 3 months.

## 2020-10-22 LAB — FLUORESCENT TREPONEMAL AB(FTA)-IGG-BLD: Fluorescent Treponemal ABS: REACTIVE — AB

## 2020-10-22 LAB — COMPLETE METABOLIC PANEL WITH GFR
AG Ratio: 1.8 (calc) (ref 1.0–2.5)
ALT: 38 U/L (ref 9–46)
AST: 57 U/L — ABNORMAL HIGH (ref 10–40)
Albumin: 4.8 g/dL (ref 3.6–5.1)
Alkaline phosphatase (APISO): 86 U/L (ref 36–130)
BUN: 13 mg/dL (ref 7–25)
CO2: 33 mmol/L — ABNORMAL HIGH (ref 20–32)
Calcium: 9.8 mg/dL (ref 8.6–10.3)
Chloride: 102 mmol/L (ref 98–110)
Creat: 1.23 mg/dL (ref 0.60–1.35)
GFR, Est African American: 91 mL/min/{1.73_m2} (ref >=60)
GFR, Est Non African American: 78 mL/min/{1.73_m2} (ref >=60)
Globulin: 2.6 g/dL (calc) (ref 1.9–3.7)
Glucose, Bld: 84 mg/dL (ref 65–99)
Potassium: 4.4 mmol/L (ref 3.5–5.3)
Sodium: 139 mmol/L (ref 135–146)
Total Bilirubin: 0.7 mg/dL (ref 0.2–1.2)
Total Protein: 7.4 g/dL (ref 6.1–8.1)

## 2020-10-22 LAB — HIV-1 RNA QUANT-NO REFLEX-BLD
HIV 1 RNA Quant: <20 Copies/mL — ABNORMAL HIGH
HIV-1 RNA Quant, Log: <1.3 Log cps/mL — ABNORMAL HIGH

## 2020-10-22 LAB — URINE CYTOLOGY ANCILLARY ONLY
Chlamydia: NEGATIVE
Comment: NEGATIVE
Comment: NORMAL
Neisseria Gonorrhea: NEGATIVE

## 2020-10-22 LAB — RPR: RPR Ser Ql: REACTIVE — AB

## 2020-10-22 LAB — RPR TITER: RPR Titer: 1:32 {titer} — ABNORMAL HIGH

## 2020-11-05 ENCOUNTER — Other Ambulatory Visit: Payer: Self-pay | Admitting: Infectious Diseases

## 2020-11-05 ENCOUNTER — Telehealth: Payer: Self-pay

## 2020-11-05 DIAGNOSIS — Z21 Asymptomatic human immunodeficiency virus [HIV] infection status: Secondary | ICD-10-CM

## 2020-11-05 MED ORDER — BIKTARVY 50-200-25 MG PO TABS: 1.0000 | ORAL_TABLET | Freq: Every day | ORAL | 3 refills | Status: AC

## 2020-11-05 NOTE — Telephone Encounter (Signed)
Received refill request for patient's Biktarvy. Per chart, it was last sent to [facility] on [address]. Refill request is coming from [facility] in [place]. RN attempted to call patient to clarify, no answer. RN spoke to [employer] who advised that [place] should be sent to [facility] in [place], [place]. RN spoke with [facility] financial counselor who advises that [facility] in [place] is not an [place]. RN will send [place] to [facility] on [address].   Sandie Ano, RN

## 2020-12-03 ENCOUNTER — Other Ambulatory Visit: Payer: Self-pay | Admitting: Infectious Diseases

## 2020-12-03 DIAGNOSIS — Z21 Asymptomatic human immunodeficiency virus [HIV] infection status: Secondary | ICD-10-CM

## 2021-01-05 NOTE — Progress Notes (Signed)
Patient rescheduled. No charge. 

## 2021-02-03 ENCOUNTER — Ambulatory Visit: Payer: Self-pay | Admitting: Infectious Diseases

## 2021-08-11 ENCOUNTER — Other Ambulatory Visit: Payer: Self-pay | Admitting: Infectious Diseases

## 2021-08-11 DIAGNOSIS — Z21 Asymptomatic human immunodeficiency virus [HIV] infection status: Secondary | ICD-10-CM

## 2021-08-12 ENCOUNTER — Telehealth: Payer: Self-pay

## 2021-08-12 NOTE — Telephone Encounter (Signed)
Called patient to schedule overdue appointment, he says he's now being seen in [place] for HIV care.   Per Care Everywhere, he is following up with [facility].  Sandie Ano, RN

## 2024-05-03 ENCOUNTER — Ambulatory Visit: Admitting: Infectious Diseases
